# Patient Record
Sex: Female | Born: 1955
Health system: Southern US, Community
[De-identification: ages and names within clinical notes are randomized; demographics above are authoritative.]

## PROBLEM LIST (undated history)

## (undated) DIAGNOSIS — Z973 Presence of spectacles and contact lenses: Secondary | ICD-10-CM

## (undated) DIAGNOSIS — R319 Hematuria, unspecified: Secondary | ICD-10-CM

## (undated) DIAGNOSIS — R3915 Urgency of urination: Secondary | ICD-10-CM

## (undated) DIAGNOSIS — M1990 Unspecified osteoarthritis, unspecified site: Secondary | ICD-10-CM

## (undated) DIAGNOSIS — Z87442 Personal history of urinary calculi: Secondary | ICD-10-CM

## (undated) DIAGNOSIS — Q639 Congenital malformation of kidney, unspecified: Secondary | ICD-10-CM

## (undated) DIAGNOSIS — T7840XA Allergy, unspecified, initial encounter: Secondary | ICD-10-CM

## (undated) DIAGNOSIS — N200 Calculus of kidney: Secondary | ICD-10-CM

## (undated) HISTORY — PX: COLONOSCOPY: SHX174

## (undated) HISTORY — PX: ROTATOR CUFF REPAIR: SHX139

## (undated) HISTORY — PX: FRACTURE SURGERY: SHX138

## (undated) HISTORY — DX: Allergy, unspecified, initial encounter: T78.40XA

---
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
## 1973-12-23 HISTORY — PX: NEPHROLITHOTOMY: SHX5134

## 1984-12-23 HISTORY — PX: TUBAL LIGATION: SHX77

## 2011-12-24 HISTORY — PX: OTHER SURGICAL HISTORY: SHX169

## 2015-09-07 ENCOUNTER — Encounter: Payer: Self-pay | Admitting: Internal Medicine

## 2015-09-07 ENCOUNTER — Ambulatory Visit (INDEPENDENT_AMBULATORY_CARE_PROVIDER_SITE_OTHER): Payer: BLUE CROSS/BLUE SHIELD | Admitting: Internal Medicine

## 2015-09-07 ENCOUNTER — Other Ambulatory Visit (INDEPENDENT_AMBULATORY_CARE_PROVIDER_SITE_OTHER): Payer: BLUE CROSS/BLUE SHIELD

## 2015-09-07 VITALS — BP 130/82 | HR 60 | Temp 98.6°F | Resp 12 | Ht 64.0 in | Wt 141.0 lb

## 2015-09-07 DIAGNOSIS — Z0000 Encounter for general adult medical examination without abnormal findings: Secondary | ICD-10-CM

## 2015-09-07 DIAGNOSIS — Q639 Congenital malformation of kidney, unspecified: Secondary | ICD-10-CM

## 2015-09-07 LAB — CBC
HCT: 41 % (ref 36.0–46.0)
HEMOGLOBIN: 13.9 g/dL (ref 12.0–15.0)
MCHC: 33.8 g/dL (ref 30.0–36.0)
MCV: 89.3 fl (ref 78.0–100.0)
PLATELETS: 213 10*3/uL (ref 150.0–400.0)
RBC: 4.59 Mil/uL (ref 3.87–5.11)
RDW: 13.6 % (ref 11.5–15.5)
WBC: 4.7 10*3/uL (ref 4.0–10.5)

## 2015-09-07 LAB — COMPREHENSIVE METABOLIC PANEL
ALT: 11 U/L (ref 0–35)
AST: 16 U/L (ref 0–37)
Albumin: 4.4 g/dL (ref 3.5–5.2)
Alkaline Phosphatase: 74 U/L (ref 39–117)
BUN: 20 mg/dL (ref 6–23)
CALCIUM: 9.6 mg/dL (ref 8.4–10.5)
CHLORIDE: 102 meq/L (ref 96–112)
CO2: 32 meq/L (ref 19–32)
Creatinine, Ser: 0.83 mg/dL (ref 0.40–1.20)
GFR: 74.17 mL/min (ref >60.00)
GLUCOSE: 92 mg/dL (ref 70–99)
POTASSIUM: 3.9 meq/L (ref 3.5–5.1)
Sodium: 138 mEq/L (ref 135–145)
Total Bilirubin: 0.5 mg/dL (ref 0.2–1.2)
Total Protein: 7.3 g/dL (ref 6.0–8.3)

## 2015-09-07 LAB — LIPID PANEL
CHOL/HDL RATIO: 3
Cholesterol: 178 mg/dL (ref 0–200)
HDL: 63 mg/dL (ref >39.00)
LDL Cholesterol: 104 mg/dL — ABNORMAL HIGH (ref 0–99)
NONHDL: 114.52
Triglycerides: 55 mg/dL (ref 0.0–149.0)
VLDL: 11 mg/dL (ref 0.0–40.0)

## 2015-09-07 LAB — URIC ACID: Uric Acid, Serum: 2.4 mg/dL (ref 2.4–7.0)

## 2015-09-07 NOTE — Assessment & Plan Note (Signed)
Has 2 sacs with 2 functioning kidneys in each sac. Per her bladder is small normal size. Kidney function normal in the past and BP normal. Checking CMP and uric acid today. History of stones.

## 2015-09-07 NOTE — Progress Notes (Signed)
   Subjective:    Patient ID: Alyssa Lamb, female    DOB: 01/04/1956, 59 y.o.   MRN: [id number]  HPI The patient is a 59 YO female coming in new for wellness. No new complaints. 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 Overall normal kidney function but problems with infections and recurrent stones.   PMH, [facility], social history reviewed and updated.   Review of Systems  Constitutional: Negative for fever, activity change, appetite change and unexpected weight change.  HENT: Negative.   Eyes: Negative.   Respiratory: Negative for cough, chest tightness, shortness of breath and wheezing.   Cardiovascular: Negative for chest pain, palpitations and leg swelling.  Gastrointestinal: Negative for nausea, abdominal pain, diarrhea, constipation and abdominal distention.  Musculoskeletal: Positive for arthralgias. Negative for myalgias, back pain and joint swelling.       In the hips  Skin: Negative.   Neurological: Negative.   Psychiatric/Behavioral: Negative.       Objective:   Physical Exam  Constitutional: She is oriented to person, place, and time. She appears well-developed and well-nourished.  HENT:  Head: Normocephalic and atraumatic.  Eyes: EOM are normal.  Neck: Normal range of motion.  Cardiovascular: Normal rate and regular rhythm.   Pulmonary/Chest: Effort normal and breath sounds normal. No respiratory distress. She has no wheezes. She has no rales.  Abdominal: Soft. Bowel sounds are normal. She exhibits no distension. There is no tenderness. There is no rebound.  Musculoskeletal: She exhibits no edema.  Neurological: She is alert and oriented to person, place, and time. Coordination normal.  Skin: Skin is warm and dry.  Psychiatric: She has a normal mood and affect.   Filed Vitals:   09/07/15 1419  BP: 130/82  Pulse: 60  Temp: 98.6 F (37 C)  TempSrc: Oral  Resp: 12  Height: 5\' 4"  (1.626 m)  Weight: 141 lb (63.957 kg)  SpO2: 98%      Assessment &  Plan:  Flu shot given at visit

## 2015-09-07 NOTE — Progress Notes (Signed)
Pre visit review using our clinic review tool, if applicable. No additional management support is needed unless otherwise documented below in the visit note. 

## 2015-09-07 NOTE — Assessment & Plan Note (Signed)
Counseled her about the shingles shot and she will think about it. Flu shot given today. Talked to her about sun cancer prevention with protective clothing and sunscreen. Talked to her about kidney stone prevention and she is drinking enough fluids.

## 2015-09-07 NOTE — Patient Instructions (Signed)
We have given you the flu shot today and will check the blood work.   Think about getting the shingles shot if you want before turning 65 as medicare does not cover it.   Come back in about 1 year, if you have any problems or questions before then please feel free to call us.   Health Maintenance Adopting a healthy lifestyle and getting preventive care can go a long way to promote health and wellness. Talk with your health care provider about what schedule of regular examinations is right for you. This is a good chance for you to check in with your provider about disease prevention and staying healthy. In between checkups, there are plenty of things you can do on your own. Experts have done a lot of research about which lifestyle changes and preventive measures are most likely to keep you healthy. Ask your health care provider for more information. WEIGHT AND DIET  Eat a healthy diet  Be sure to include plenty of vegetables, fruits, low-fat dairy products, and lean protein.  Do not eat a lot of foods high in solid fats, added sugars, or salt.  Get regular exercise. This is one of the most important things you can do for your health.  Most adults should exercise for at least 150 minutes each week. The exercise should increase your heart rate and make you sweat (moderate-intensity exercise).  Most adults should also do strengthening exercises at least twice a week. This is in addition to the moderate-intensity exercise.  Maintain a healthy weight  Body mass index (BMI) is a measurement that can be used to identify possible weight problems. It estimates body fat based on height and weight. Your health care provider can help determine your BMI and help you achieve or maintain a healthy weight.  For females 44 years of age and older:   A BMI below 18.5 is considered underweight.  A BMI of 18.5 to 24.9 is normal.  A BMI of 25 to 29.9 is considered overweight.  A BMI of 30 and above is  considered obese.  Watch levels of cholesterol and blood lipids  You should start having your blood tested for lipids and cholesterol at 61 years of age, then have this test every 5 years.  You may need to have your cholesterol levels checked more often if:  Your lipid or cholesterol levels are high.  You are older than 61 years of age.  You are at high risk for heart disease.  CANCER SCREENING   Lung Cancer  Lung cancer screening is recommended for adults 12-25 years old who are at high risk for lung cancer because of a history of smoking.  A yearly low-dose CT scan of the lungs is recommended for people who:  Currently smoke.  Have quit within the past 15 years.  Have at least a 30-pack-year history of smoking. A pack year is smoking an average of one pack of cigarettes a day for 1 year.  Yearly screening should continue until it has been 15 years since you quit.  Yearly screening should stop if you develop a health problem that would prevent you from having lung cancer treatment.  Breast Cancer  Practice breast self-awareness. This means understanding how your breasts normally appear and feel.  It also means doing regular breast self-exams. Let your health care provider know about any changes, no matter how small.  If you are in your 20s or 30s, you should have a clinical breast exam (CBE)  by a health care provider every 1-3 years as part of a regular health exam.  If you are 40 or older, have a CBE every year. Also consider having a breast X-ray (mammogram) every year.  If you have a family history of breast cancer, talk to your health care provider about genetic screening.  If you are at high risk for breast cancer, talk to your health care provider about having an MRI and a mammogram every year.  Breast cancer gene (BRCA) assessment is recommended for women who have family members with BRCA-related cancers. BRCA-related cancers  include:  Breast.  Ovarian.  Tubal.  Peritoneal cancers.  Results of the assessment will determine the need for genetic counseling and BRCA1 and BRCA2 testing. Cervical Cancer Routine pelvic examinations to screen for cervical cancer are no longer recommended for nonpregnant women who are considered low risk for cancer of the pelvic organs (ovaries, uterus, and vagina) and who do not have symptoms. A pelvic examination may be necessary if you have symptoms including those associated with pelvic infections. Ask your health care provider if a screening pelvic exam is right for you.   The Pap test is the screening test for cervical cancer for women who are considered at risk.  If you had a hysterectomy for a problem that was not cancer or a condition that could lead to cancer, then you no longer need Pap tests.  If you are older than 65 years, and you have had normal Pap tests for the past 10 years, you no longer need to have Pap tests.  If you have had past treatment for cervical cancer or a condition that could lead to cancer, you need Pap tests and screening for cancer for at least 20 years after your treatment.  If you no longer get a Pap test, assess your risk factors if they change (such as having a new sexual partner). This can affect whether you should start being screened again.  Some women have medical problems that increase their chance of getting cervical cancer. If this is the case for you, your health care provider may recommend more frequent screening and Pap tests.  The human papillomavirus (HPV) test is another test that may be used for cervical cancer screening. The HPV test looks for the virus that can cause cell changes in the cervix. The cells collected during the Pap test can be tested for HPV.  The HPV test can be used to screen women 30 years of age and older. Getting tested for HPV can extend the interval between normal Pap tests from three to five years.  An HPV  test also should be used to screen women of any age who have unclear Pap test results.  After 61 years of age, women should have HPV testing as often as Pap tests.  Colorectal Cancer  This type of cancer can be detected and often prevented.  Routine colorectal cancer screening usually begins at 61 years of age and continues through 61 years of age.  Your health care provider may recommend screening at an earlier age if you have risk factors for colon cancer.  Your health care provider may also recommend using home test kits to check for hidden blood in the stool.  A small camera at the end of a tube can be used to examine your colon directly (sigmoidoscopy or colonoscopy). This is done to check for the earliest forms of colorectal cancer.  Routine screening usually begins at age 50.  Direct   examination of the colon should be repeated every 5-10 years through 61 years of age. However, you may need to be screened more often if early forms of precancerous polyps or small growths are found. Skin Cancer  Check your skin from head to toe regularly.  Tell your health care provider about any new moles or changes in moles, especially if there is a change in a mole's shape or color.  Also tell your health care provider if you have a mole that is larger than the size of a pencil eraser.  Always use sunscreen. Apply sunscreen liberally and repeatedly throughout the day.  Protect yourself by wearing long sleeves, pants, a wide-brimmed hat, and sunglasses whenever you are outside. HEART DISEASE, DIABETES, AND HIGH BLOOD PRESSURE   Have your blood pressure checked at least every 1-2 years. High blood pressure causes heart disease and increases the risk of stroke.  If you are between 55 years and 79 years old, ask your health care provider if you should take aspirin to prevent strokes.  Have regular diabetes screenings. This involves taking a blood sample to check your fasting blood sugar  level.  If you are at a normal weight and have a low risk for diabetes, have this test once every three years after 61 years of age.  If you are overweight and have a high risk for diabetes, consider being tested at a younger age or more often. PREVENTING INFECTION  Hepatitis B  If you have a higher risk for hepatitis B, you should be screened for this virus. You are considered at high risk for hepatitis B if:  You were born in a country where hepatitis B is common. Ask your health care provider which countries are considered high risk.  Your parents were born in a high-risk country, and you have not been immunized against hepatitis B (hepatitis B vaccine).  You have HIV or AIDS.  You use needles to inject street drugs.  You live with someone who has hepatitis B.  You have had sex with someone who has hepatitis B.  You get hemodialysis treatment.  You take certain medicines for conditions, including cancer, organ transplantation, and autoimmune conditions. Hepatitis C  Blood testing is recommended for:  Everyone born from 1945 through 1965.  Anyone with known risk factors for hepatitis C. Sexually transmitted infections (STIs)  You should be screened for sexually transmitted infections (STIs) including gonorrhea and chlamydia if:  You are sexually active and are younger than 61 years of age.  You are older than 61 years of age and your health care provider tells you that you are at risk for this type of infection.  Your sexual activity has changed since you were last screened and you are at an increased risk for chlamydia or gonorrhea. Ask your health care provider if you are at risk.  If you do not have HIV, but are at risk, it may be recommended that you take a prescription medicine daily to prevent HIV infection. This is called pre-exposure prophylaxis (PrEP). You are considered at risk if:  You are sexually active and do not regularly use condoms or know the HIV status  of your partner(s).  You take drugs by injection.  You are sexually active with a partner who has HIV. Talk with your health care provider about whether you are at high risk of being infected with HIV. If you choose to begin PrEP, you should first be tested for HIV. You should then be tested   every 3 months for as long as you are taking PrEP.  PREGNANCY   If you are premenopausal and you may become pregnant, ask your health care provider about preconception counseling.  If you may become pregnant, take 400 to 800 micrograms (mcg) of folic acid every day.  If you want to prevent pregnancy, talk to your health care provider about birth control (contraception). OSTEOPOROSIS AND MENOPAUSE   Osteoporosis is a disease in which the bones lose minerals and strength with aging. This can result in serious bone fractures. Your risk for osteoporosis can be identified using a bone density scan.  If you are 65 years of age or older, or if you are at risk for osteoporosis and fractures, ask your health care provider if you should be screened.  Ask your health care provider whether you should take a calcium or vitamin D supplement to lower your risk for osteoporosis.  Menopause may have certain physical symptoms and risks.  Hormone replacement therapy may reduce some of these symptoms and risks. Talk to your health care provider about whether hormone replacement therapy is right for you.  HOME CARE INSTRUCTIONS   Schedule regular health, dental, and eye exams.  Stay current with your immunizations.   Do not use any tobacco products including cigarettes, chewing tobacco, or electronic cigarettes.  If you are pregnant, do not drink alcohol.  If you are breastfeeding, limit how much and how often you drink alcohol.  Limit alcohol intake to no more than 1 drink per day for nonpregnant women. One drink equals 12 ounces of beer, 5 ounces of wine, or 1 ounces of hard liquor.  Do not use street  drugs.  Do not share needles.  Ask your health care provider for help if you need support or information about quitting drugs.  Tell your health care provider if you often feel depressed.  Tell your health care provider if you have ever been abused or do not feel safe at home. Document Released: 06/24/2011 Document Revised: 04/25/2014 Document Reviewed: 11/10/2013 [employer] Patient Information 2015 [employer]. This information is not intended to replace advice given to you by your health care provider. Make sure you discuss any questions you have with your health care provider.  

## 2015-09-11 ENCOUNTER — Telehealth: Payer: Self-pay | Admitting: Internal Medicine

## 2015-09-11 DIAGNOSIS — N950 Postmenopausal bleeding: Secondary | ICD-10-CM

## 2015-09-11 NOTE — Telephone Encounter (Signed)
Patient called in advising that she has bright red blood in her urine. She is requesting a referral to a urologist. Please contact patient with update.

## 2015-09-11 NOTE — Telephone Encounter (Signed)
Will place referral for Ob/Gyn or she can get in with an Ob/Gyn

## 2015-09-11 NOTE — Telephone Encounter (Signed)
Patient called to advise that she has 100% verified that the bleeding is not in her urine, but from her uterus.

## 2015-09-12 ENCOUNTER — Encounter: Payer: Self-pay | Admitting: Gynecology

## 2015-09-12 ENCOUNTER — Emergency Department (HOSPITAL_COMMUNITY): Payer: BLUE CROSS/BLUE SHIELD

## 2015-09-12 ENCOUNTER — Emergency Department (HOSPITAL_COMMUNITY)
Admission: EM | Admit: 2015-09-12 | Discharge: 2015-09-12 | Disposition: A | Discharge status: Home / Self Care | Payer: BLUE CROSS/BLUE SHIELD | Attending: Emergency Medicine | Admitting: Emergency Medicine

## 2015-09-12 ENCOUNTER — Encounter (INDEPENDENT_AMBULATORY_CARE_PROVIDER_SITE_OTHER): Payer: Self-pay | Admitting: Gynecology

## 2015-09-12 ENCOUNTER — Encounter (HOSPITAL_COMMUNITY): Payer: Self-pay | Admitting: Emergency Medicine

## 2015-09-12 DIAGNOSIS — Z880 Allergy status to penicillin: Secondary | ICD-10-CM | POA: Insufficient documentation

## 2015-09-12 DIAGNOSIS — Z87440 Personal history of urinary (tract) infections: Secondary | ICD-10-CM | POA: Diagnosis not present

## 2015-09-12 DIAGNOSIS — Z87891 Personal history of nicotine dependence: Secondary | ICD-10-CM | POA: Insufficient documentation

## 2015-09-12 DIAGNOSIS — Z79899 Other long term (current) drug therapy: Secondary | ICD-10-CM | POA: Insufficient documentation

## 2015-09-12 DIAGNOSIS — N200 Calculus of kidney: Secondary | ICD-10-CM | POA: Diagnosis not present

## 2015-09-12 DIAGNOSIS — R109 Unspecified abdominal pain: Secondary | ICD-10-CM | POA: Diagnosis present

## 2015-09-12 LAB — URINALYSIS, ROUTINE W REFLEX MICROSCOPIC
BILIRUBIN URINE: NEGATIVE
GLUCOSE, UA: NEGATIVE mg/dL
KETONES UR: 15 mg/dL — AB
Nitrite: NEGATIVE
PH: 6 (ref 5.0–8.0)
Protein, ur: >300 mg/dL — AB
Specific Gravity, Urine: 1.019 (ref 1.005–1.030)
Urobilinogen, UA: 0.2 mg/dL (ref 0.0–1.0)

## 2015-09-12 LAB — CBC WITH DIFFERENTIAL/PLATELET
BASOS PCT: 1 %
Basophils Absolute: 0 10*3/uL (ref 0.0–0.1)
EOS ABS: 0.2 10*3/uL (ref 0.0–0.7)
Eosinophils Relative: 5 %
HEMATOCRIT: 38.4 % (ref 36.0–46.0)
Hemoglobin: 13.1 g/dL (ref 12.0–15.0)
Lymphocytes Relative: 40 %
Lymphs Abs: 1.6 10*3/uL (ref 0.7–4.0)
MCH: 30.3 pg (ref 26.0–34.0)
MCHC: 34.1 g/dL (ref 30.0–36.0)
MCV: 88.9 fL (ref 78.0–100.0)
MONO ABS: 0.3 10*3/uL (ref 0.1–1.0)
MONOS PCT: 7 %
NEUTROS ABS: 1.9 10*3/uL (ref 1.7–7.7)
Neutrophils Relative %: 47 %
Platelets: 180 10*3/uL (ref 150–400)
RBC: 4.32 MIL/uL (ref 3.87–5.11)
RDW: 13.2 % (ref 11.5–15.5)
WBC: 3.9 10*3/uL — ABNORMAL LOW (ref 4.0–10.5)

## 2015-09-12 LAB — COMPREHENSIVE METABOLIC PANEL
ALBUMIN: 3.9 g/dL (ref 3.5–5.0)
ALK PHOS: 73 U/L (ref 38–126)
ALT: 14 U/L (ref 14–54)
AST: 18 U/L (ref 15–41)
Anion gap: 5 (ref 5–15)
BILIRUBIN TOTAL: 0.4 mg/dL (ref 0.3–1.2)
BUN: 21 mg/dL — ABNORMAL HIGH (ref 6–20)
CALCIUM: 8.9 mg/dL (ref 8.9–10.3)
CO2: 27 mmol/L (ref 22–32)
CREATININE: 0.8 mg/dL (ref 0.44–1.00)
Chloride: 102 mmol/L (ref 101–111)
GFR calc Af Amer: >60 mL/min (ref >60)
GFR calc non Af Amer: >60 mL/min (ref >60)
GLUCOSE: 100 mg/dL — AB (ref 65–99)
Potassium: 4.4 mmol/L (ref 3.5–5.1)
SODIUM: 134 mmol/L — AB (ref 135–145)
TOTAL PROTEIN: 6.3 g/dL — AB (ref 6.5–8.1)

## 2015-09-12 LAB — LIPASE, BLOOD: Lipase: 32 U/L (ref 22–51)

## 2015-09-12 LAB — URINE MICROSCOPIC-ADD ON

## 2015-09-12 MED ORDER — SODIUM CHLORIDE 0.9 % IV BOLUS (SEPSIS): 500.0000 mL | Freq: Once | INTRAVENOUS | Status: DC

## 2015-09-12 MED ORDER — KETOROLAC TROMETHAMINE 30 MG/ML IJ SOLN: 30.0000 mg | Freq: Once | INTRAMUSCULAR | Status: DC

## 2015-09-12 MED ORDER — ONDANSETRON HCL 4 MG PO TABS: 4.0000 mg | ORAL_TABLET | Freq: Four times a day (QID) | ORAL | Status: DC

## 2015-09-12 MED ORDER — OXYCODONE-ACETAMINOPHEN 5-325 MG PO TABS: 1.0000 | ORAL_TABLET | ORAL | Status: DC | PRN

## 2015-09-12 NOTE — Discharge Instructions (Signed)

## 2015-09-12 NOTE — ED Notes (Signed)
PT refusing IV and pain meds; states has no pain.

## 2015-09-12 NOTE — ED Notes (Signed)
Patient transported to Ultrasound 

## 2015-09-12 NOTE — ED Notes (Signed)
Pt arrives from home via [facility] c/o flank pain beginning tonight at 0500.  Pt reports hematuria since Friday with a hx of kidney stones.  Pt also reports norm HR is mid 50's.  Pt endorses nausea, denies vomiting.  Pt reports no pain at this time.

## 2015-09-12 NOTE — ED Provider Notes (Signed)
CSN: [id number]     Arrival date & time 09/12/15  0608 History   First MD Initiated Contact with Patient 09/12/15 [phone]     Chief Complaint  Patient presents with  . Flank Pain     (Consider location/radiation/quality/duration/timing/severity/associated sxs/prior Treatment) HPI   Patient is a 59 year old female with history of kidney stones with multiple stenting, otherwise healthy, recently moved here, she comes into the emergency department today with sudden onset left flank pain that began at 5 AM associated with nausea and with hematuria since Friday. She stated the pain was sharp and located in the low left flank without any radiation, which is unusual for her kidney stones. She had some nausea but did not have any vomiting. She has not had any fever, chills, sweats, diarrhea. She has not had any abdominal distention or discomfort with eating and has not had any acid reflux symptoms.  She denies anterior abdomen or pelvis pain. She was recently established with [facility] for primary care and had discussed getting established with urology but has not yet seen a urologist in [place]. She reports a history of multiple stones in both kidneys, multiple stenting and unsuccessful lithotripsy in the past. She states that she has never actually passed a stone.  Upon my exam she is not complaining of any flank pain or nausea. She does not have any other complaints this time, denies chest pain, shortness of breath, weakness, headache.  Past Medical History  Diagnosis Date  . Kidney stones   . Urine incontinence   . UTI (urinary tract infection)    Past Surgical History  Procedure Laterality Date  . Shoulder surgery Right   . Tubular pregnancy    . Ankle surgery Right   . Kidney stone  Right     kidnent stone removed surgically 1984    Family History  Problem Relation Age of Onset  . Arthritis Mother   . Hyperlipidemia Mother   . Heart disease Mother   . Hypertension Mother   . Diabetes  Mother    Social History  Substance Use Topics  . Smoking status: Former Research scientist (life sciences)  . Smokeless tobacco: None  . Alcohol Use: No   OB History    No data available     Review of Systems  Constitutional: Negative.  Negative for fever, activity change, appetite change and fatigue.  HENT: Negative.   Eyes: Negative.   Respiratory: Negative.  Negative for cough and shortness of breath.   Cardiovascular: Negative.  Negative for chest pain, palpitations and leg swelling.  Gastrointestinal: Negative for vomiting, abdominal pain, diarrhea, constipation, blood in stool, abdominal distention, anal bleeding and rectal pain.  Genitourinary: Positive for hematuria and flank pain. Negative for difficulty urinating and pelvic pain.  Musculoskeletal: Negative.   Skin: Negative.   Neurological: Negative.   Hematological: Negative.   Psychiatric/Behavioral: Negative.       Allergies  Penicillins  Home Medications   Prior to Admission medications   Medication Sig Start Date End Date Taking? Authorizing Leslea Vowles  acetaminophen (TYLENOL) 500 MG tablet Take 1,000 mg by mouth every 6 (six) hours as needed for moderate pain.   Yes Historical Kailon Treese, MD  beta carotene w/minerals (OCUVITE) tablet Take 1 tablet by mouth daily.   Yes Historical Monette Omara, MD  Cholecalciferol (VITAMIN D) 2000 UNITS tablet Take 2,000 Units by mouth daily.   Yes Historical Helyne Genther, MD  Doxylamine Succinate, Sleep, (SLEEP AID PO) Take by mouth.   Yes Historical Elisha Cooksey, MD  Lactobacillus (PROBIOTIC  ACIDOPHILUS PO) Take by mouth.   Yes Historical Marye Eagen, MD  ondansetron (ZOFRAN) 4 MG tablet Take 1 tablet (4 mg total) by mouth every 6 (six) hours. 09/12/15   Delsa Grana, PA-C  oxyCODONE-acetaminophen (PERCOCET) 5-325 MG per tablet Take 1 tablet by mouth every 4 (four) hours as needed. 09/12/15   Delsa Grana, PA-C   BP 117/71 mmHg  Pulse 52  Temp(Src) 97.9 F (36.6 C) (Oral)  Resp 22  Ht 5\' 5"  (1.651 m)  Wt 145 lb  (65.772 kg)  BMI 24.13 kg/m2  SpO2 99% Physical Exam  Constitutional: She is oriented to person, place, and time. She appears well-developed and well-nourished. No distress.  Well appearing female, appears stated age, does not appear to be uncomfortable, nontoxic-appearing  HENT:  Head: Normocephalic and atraumatic.  Nose: Nose normal.  Mouth/Throat: Oropharynx is clear and moist. No oropharyngeal exudate.  Eyes: Conjunctivae and EOM are normal. Pupils are equal, round, and reactive to light. Right eye exhibits no discharge. Left eye exhibits no discharge. No scleral icterus.  Neck: Normal range of motion. No JVD present. No tracheal deviation present. No thyromegaly present.  Cardiovascular: Normal rate, regular rhythm, normal heart sounds and intact distal pulses.  Exam reveals no gallop and no friction rub.   No murmur heard. Pulmonary/Chest: Effort normal and breath sounds normal. No respiratory distress. She has no wheezes. She has no rales. She exhibits no tenderness.  Abdominal: Soft. Bowel sounds are normal. She exhibits no distension and no mass. There is no tenderness. There is no rebound and no guarding.  Scar over right flank, no CVA tenderness bilaterally  Musculoskeletal: Normal range of motion. She exhibits no edema or tenderness.  Lymphadenopathy:    She has no cervical adenopathy.  Neurological: She is alert and oriented to person, place, and time. She has normal reflexes. No cranial nerve deficit. She exhibits normal muscle tone. Coordination normal.  Skin: Skin is warm and dry. No rash noted. She is not diaphoretic. No erythema. No pallor.  Psychiatric: She has a normal mood and affect. Her behavior is normal. Judgment and thought content normal.  Nursing note and vitals reviewed.   ED Course  Procedures (including critical care time) Labs Review Labs Reviewed  URINALYSIS, ROUTINE W REFLEX MICROSCOPIC (NOT AT [facility]) - Abnormal; Notable for the following:    Color,  Urine RED (*)    APPearance TURBID (*)    Hgb urine dipstick LARGE (*)    Ketones, ur 15 (*)    Protein, ur >300 (*)    Leukocytes, UA SMALL (*)    All other components within normal limits  CBC WITH DIFFERENTIAL/PLATELET - Abnormal; Notable for the following:    WBC 3.9 (*)    All other components within normal limits  COMPREHENSIVE METABOLIC PANEL - Abnormal; Notable for the following:    Sodium 134 (*)    Glucose, Bld 100 (*)    BUN 21 (*)    Total Protein 6.3 (*)    All other components within normal limits  LIPASE, BLOOD  URINE MICROSCOPIC-ADD ON    Imaging Review US Abdomen Complete  09/12/2015   CLINICAL DATA:  Left flank pain, nausea.  History kidney stones.  EXAM: ULTRASOUND ABDOMEN COMPLETE  COMPARISON:  None.  FINDINGS: Gallbladder: 3 mm and 4 mm polyps noted along the anterior gallbladder wall. No stones. No wall thickening. Negative sonographic Murphy's.  Common bile duct: Diameter: Normal caliber, 6 mm.  Liver: 7 mm cyst. No other focal abnormality.  No biliary ductal dilatation. Normal echotexture.  IVC: No abnormality visualized.  Pancreas: Visualized portion unremarkable.  Spleen: Size and appearance within normal limits.  Right Kidney: Length: 11.8 cm. 1 cm midpole stone. 8 mm lower pole stone. No obstruction. No hydronephrosis.  Left Kidney: Length: 12.8 cm. 13 mm upper pole stone and adjacent 14 mm upper pole stone. 18 mm lower pole cyst. Normal echotexture. No hydronephrosis or obstruction.  Abdominal aorta: No aneurysm visualized.  Other findings: None.  IMPRESSION: Bilateral nephrolithiasis.  No hydronephrosis.  Small gallbladder wall polyps.  No acute findings.   Electronically Signed   By: Rolm Baptise M.D.   On: 09/12/2015 09:16   I have personally reviewed and evaluated these images and lab results as part of my medical decision-making.   EKG Interpretation None      MDM   Final diagnoses:  Nephrolithiasis    Patient with left flank pain, hematuria and  complex kidney stone history She had sudden onset left flank pain this morning which has spontaneously resolved, was associated with nausea but no active vomiting and currently asymptomatic at this time Patient had labs and a visit with Dr. Doug Sou 5 days ago, all lab work was within normal limits, and patient was intent on getting established with urology. There are no records to refer to or through care everywhere. Will obtain basic labs, urinalysis and imaging for evaluation of nephrolithiasis/hydronephrosis.   The patient is requesting to see a urologist with [facility], this referral will likely need to be initiated with her PCP however I have offered to contact our on-call urologist with resultant to establish care more quickly if needed.  7:51 AM The nurse informed me that the patient is refusing IV her pain medicine due to not having any pain currently. She is refusing IM Toradol.   9:37 AM Pt has bilateral kidney stones, she does not have any obstruction and does not have any hydronephrosis.  Patient has had no vomiting while here in the ER. Her urine does not appear infected at this time. She is afebrile. She will be discharged home and will be instructed to seek referral from her PCP but Dr. Minus Liberty is on call for urology and was also given as a resource to establish with for urology care. The patient was given a prescription for Zofran and Percocet to be able to manage her pain at home. She was instructed to return to the emergency department with refractory vomiting nausea, fever, chills, sweats or pain that is not managed with her over-the-counter meds and with the Percocet.  Filed Vitals:   09/12/15 0614 09/12/15 0615 09/12/15 0800  BP: 141/79 155/77 117/71  Pulse: 59 52 52  Temp: 97.9 F (36.6 C)    TempSrc: Oral    Resp: 22    Height: 5\' 5"  (1.651 m)    Weight: 145 lb (65.772 kg)    SpO2: 100% 100% 99%     Delsa Grana, PA-C 09/12/15 0945  Varney Biles, MD 09/12/15  1635

## 2015-09-12 NOTE — ED Notes (Signed)
Pt ambulated to restroom without distress.  

## 2015-09-13 NOTE — Progress Notes (Signed)
Error entry

## 2015-09-14 ENCOUNTER — Ambulatory Visit: Payer: BLUE CROSS/BLUE SHIELD | Admitting: Internal Medicine

## 2015-09-18 ENCOUNTER — Encounter (HOSPITAL_BASED_OUTPATIENT_CLINIC_OR_DEPARTMENT_OTHER): Payer: Self-pay | Admitting: *Deleted

## 2015-09-18 ENCOUNTER — Other Ambulatory Visit: Payer: Self-pay | Admitting: Urology

## 2015-09-19 ENCOUNTER — Encounter (HOSPITAL_BASED_OUTPATIENT_CLINIC_OR_DEPARTMENT_OTHER): Payer: Self-pay | Admitting: *Deleted

## 2015-09-19 NOTE — Progress Notes (Signed)
NPO AFTER MN WITH EXCEPTION CLEAR LIQUIDS UNTIL 0830 (NO CREAM/MILK PRODUCTS).  ARRIVE AT 1300.  CURRENT LAB RESULTS IN CHART AND EPIC. MAY TAKE OXYCODONE / ZOFRAN IF NEEDED AM DOS W/ SIPS OF WATER.

## 2015-09-22 ENCOUNTER — Ambulatory Visit (HOSPITAL_BASED_OUTPATIENT_CLINIC_OR_DEPARTMENT_OTHER)
Admission: RE | Admit: 2015-09-22 | Discharge: 2015-09-22 | Disposition: A | Discharge status: Home / Self Care | Payer: BLUE CROSS/BLUE SHIELD | Source: Ambulatory Visit | Attending: Urology | Admitting: Urology

## 2015-09-22 ENCOUNTER — Ambulatory Visit (HOSPITAL_BASED_OUTPATIENT_CLINIC_OR_DEPARTMENT_OTHER): Payer: BLUE CROSS/BLUE SHIELD | Admitting: Anesthesiology

## 2015-09-22 ENCOUNTER — Encounter (HOSPITAL_BASED_OUTPATIENT_CLINIC_OR_DEPARTMENT_OTHER)
Admission: RE | Disposition: A | Discharge status: Home / Self Care | Payer: Self-pay | Source: Ambulatory Visit | Attending: Urology

## 2015-09-22 ENCOUNTER — Encounter (HOSPITAL_BASED_OUTPATIENT_CLINIC_OR_DEPARTMENT_OTHER): Payer: Self-pay | Admitting: *Deleted

## 2015-09-22 DIAGNOSIS — Q638 Other specified congenital malformations of kidney: Secondary | ICD-10-CM | POA: Diagnosis not present

## 2015-09-22 DIAGNOSIS — Z87891 Personal history of nicotine dependence: Secondary | ICD-10-CM | POA: Diagnosis not present

## 2015-09-22 DIAGNOSIS — Z87442 Personal history of urinary calculi: Secondary | ICD-10-CM | POA: Insufficient documentation

## 2015-09-22 DIAGNOSIS — N132 Hydronephrosis with renal and ureteral calculous obstruction: Secondary | ICD-10-CM | POA: Diagnosis not present

## 2015-09-22 DIAGNOSIS — M1990 Unspecified osteoarthritis, unspecified site: Secondary | ICD-10-CM | POA: Diagnosis not present

## 2015-09-22 DIAGNOSIS — Z79899 Other long term (current) drug therapy: Secondary | ICD-10-CM | POA: Diagnosis not present

## 2015-09-22 DIAGNOSIS — Z79891 Long term (current) use of opiate analgesic: Secondary | ICD-10-CM | POA: Diagnosis not present

## 2015-09-22 DIAGNOSIS — N201 Calculus of ureter: Secondary | ICD-10-CM

## 2015-09-22 HISTORY — DX: Personal history of urinary calculi: Z87.442

## 2015-09-22 HISTORY — PX: CYSTOSCOPY WITH RETROGRADE PYELOGRAM, URETEROSCOPY AND STENT PLACEMENT: SHX5789

## 2015-09-22 HISTORY — DX: Urgency of urination: R39.15

## 2015-09-22 HISTORY — DX: Hematuria, unspecified: R31.9

## 2015-09-22 HISTORY — DX: Calculus of kidney: N20.0

## 2015-09-22 HISTORY — DX: Unspecified osteoarthritis, unspecified site: M19.90

## 2015-09-22 HISTORY — PX: HOLMIUM LASER APPLICATION: SHX5852

## 2015-09-22 HISTORY — DX: Congenital malformation of kidney, unspecified: Q63.9

## 2015-09-22 HISTORY — DX: Presence of spectacles and contact lenses: Z97.3

## 2015-09-22 SURGERY — CYSTOURETEROSCOPY, WITH RETROGRADE PYELOGRAM AND STENT INSERTION
Anesthesia: General | Site: Renal | Laterality: Left

## 2015-09-22 MED ORDER — DEXAMETHASONE SODIUM PHOSPHATE 4 MG/ML IJ SOLN
INTRAMUSCULAR | Status: DC | PRN
  Administered 2015-09-22: 10 mg via INTRAVENOUS

## 2015-09-22 MED ORDER — LIDOCAINE HCL (CARDIAC) 20 MG/ML IV SOLN
INTRAVENOUS | Status: DC | PRN
  Administered 2015-09-22: 60 mg via INTRAVENOUS

## 2015-09-22 MED ORDER — IOHEXOL 350 MG/ML SOLN
INTRAVENOUS | Status: DC | PRN
  Administered 2015-09-22: 10 mL

## 2015-09-22 MED ORDER — SULFAMETHOXAZOLE-TRIMETHOPRIM 800-160 MG PO TABS: 1.0000 | ORAL_TABLET | Freq: Two times a day (BID) | ORAL | Status: DC

## 2015-09-22 MED ORDER — KETOROLAC TROMETHAMINE 30 MG/ML IJ SOLN
INTRAMUSCULAR | Status: DC | PRN
  Administered 2015-09-22: 30 mg via INTRAVENOUS

## 2015-09-22 MED ORDER — FENTANYL CITRATE (PF) 100 MCG/2ML IJ SOLN
INTRAMUSCULAR | Status: AC
  Filled 2015-09-22: qty 4

## 2015-09-22 MED ORDER — CIPROFLOXACIN IN D5W 400 MG/200ML IV SOLN
400.0000 mg | INTRAVENOUS | Status: DC
  Filled 2015-09-22: qty 200

## 2015-09-22 MED ORDER — MIDAZOLAM HCL 2 MG/2ML IJ SOLN
INTRAMUSCULAR | Status: AC
  Filled 2015-09-22: qty 2

## 2015-09-22 MED ORDER — PROMETHAZINE HCL 25 MG/ML IJ SOLN
6.2500 mg | INTRAMUSCULAR | Status: DC | PRN
Start: 2015-09-22 — End: 2015-09-22
  Filled 2015-09-22: qty 1

## 2015-09-22 MED ORDER — SODIUM CHLORIDE 0.9 % IR SOLN
Status: DC | PRN
  Administered 2015-09-22: 4000 mL

## 2015-09-22 MED ORDER — ONDANSETRON HCL 4 MG/2ML IJ SOLN
INTRAMUSCULAR | Status: DC | PRN
  Administered 2015-09-22: 4 mg via INTRAVENOUS

## 2015-09-22 MED ORDER — LACTATED RINGERS IV SOLN
INTRAVENOUS | Status: DC
  Administered 2015-09-22: 14:00:00 via INTRAVENOUS
  Filled 2015-09-22: qty 1000

## 2015-09-22 MED ORDER — CIPROFLOXACIN IN D5W 400 MG/200ML IV SOLN
INTRAVENOUS | Status: AC
  Filled 2015-09-22: qty 200

## 2015-09-22 MED ORDER — SCOPOLAMINE 1 MG/3DAYS TD PT72
MEDICATED_PATCH | TRANSDERMAL | Status: AC
  Filled 2015-09-22: qty 1

## 2015-09-22 MED ORDER — OXYBUTYNIN CHLORIDE 5 MG PO TABS: 5.0000 mg | ORAL_TABLET | Freq: Three times a day (TID) | ORAL | Status: DC

## 2015-09-22 MED ORDER — ACETAMINOPHEN 10 MG/ML IV SOLN
INTRAVENOUS | Status: DC | PRN
  Administered 2015-09-22: 1000 mg via INTRAVENOUS

## 2015-09-22 MED ORDER — FENTANYL CITRATE (PF) 100 MCG/2ML IJ SOLN
INTRAMUSCULAR | Status: DC | PRN
  Administered 2015-09-22: 50 ug via INTRAVENOUS

## 2015-09-22 MED ORDER — SCOPOLAMINE 1 MG/3DAYS TD PT72
1.0000 | MEDICATED_PATCH | Freq: Once | TRANSDERMAL | Status: DC
  Administered 2015-09-22: 1.5 mg via TRANSDERMAL
  Filled 2015-09-22: qty 1

## 2015-09-22 MED ORDER — LACTATED RINGERS IV SOLN
INTRAVENOUS | Status: DC
  Filled 2015-09-22: qty 1000

## 2015-09-22 MED ORDER — PROPOFOL 10 MG/ML IV BOLUS
INTRAVENOUS | Status: DC | PRN
  Administered 2015-09-22: 200 mg via INTRAVENOUS

## 2015-09-22 MED ORDER — FENTANYL CITRATE (PF) 100 MCG/2ML IJ SOLN
25.0000 ug | INTRAMUSCULAR | Status: DC | PRN
  Filled 2015-09-22: qty 1

## 2015-09-22 MED ORDER — MEPERIDINE HCL 25 MG/ML IJ SOLN
6.2500 mg | INTRAMUSCULAR | Status: DC | PRN
  Filled 2015-09-22: qty 1

## 2015-09-22 MED ORDER — CIPROFLOXACIN IN D5W 400 MG/200ML IV SOLN
INTRAVENOUS | Status: DC | PRN
  Administered 2015-09-22: 400 mg via INTRAVENOUS

## 2015-09-22 MED ORDER — PHENYLEPHRINE HCL 10 MG/ML IJ SOLN
INTRAMUSCULAR | Status: DC | PRN
  Administered 2015-09-22: 80 ug via INTRAVENOUS

## 2015-09-22 SURGICAL SUPPLY — 25 items
ADAPTER CATH URET PLST 4-6FR (CATHETERS) IMPLANT
BAG DRAIN URO-CYSTO SKYTR STRL (DRAIN) ×3 IMPLANT
BASKET LASER NITINOL 1.9FR (BASKET) ×3 IMPLANT
CANISTER SUCT LVC 12 LTR MEDI- (MISCELLANEOUS) IMPLANT
CATH INTERMIT  6FR 70CM (CATHETERS) IMPLANT
CLOTH BEACON ORANGE TIMEOUT ST (SAFETY) ×3 IMPLANT
GLOVE BIO SURGEON STRL SZ8 (GLOVE) ×3 IMPLANT
GLOVE BIOGEL M 6.5 STRL (GLOVE) ×3 IMPLANT
GLOVE INDICATOR 6.5 STRL GRN (GLOVE) ×6 IMPLANT
GOWN STRL REUS W/ TWL LRG LVL3 (GOWN DISPOSABLE) ×1 IMPLANT
GOWN STRL REUS W/ TWL XL LVL3 (GOWN DISPOSABLE) ×1 IMPLANT
GOWN STRL REUS W/TWL LRG LVL3 (GOWN DISPOSABLE) ×5 IMPLANT
GOWN STRL REUS W/TWL XL LVL3 (GOWN DISPOSABLE) ×5 IMPLANT
GUIDEWIRE 0.038 PTFE COATED (WIRE) IMPLANT
GUIDEWIRE ANG ZIPWIRE 038X150 (WIRE) IMPLANT
GUIDEWIRE STR DUAL SENSOR (WIRE) ×3 IMPLANT
IV NS 1000ML (IV SOLUTION) ×2
IV NS 1000ML BAXH (IV SOLUTION) ×1 IMPLANT
IV NS IRRIG 3000ML ARTHROMATIC (IV SOLUTION) ×3 IMPLANT
LASER FIBER DISP (UROLOGICAL SUPPLIES) IMPLANT
MANIFOLD NEPTUNE II (INSTRUMENTS) ×3 IMPLANT
NS IRRIG 500ML POUR BTL (IV SOLUTION) IMPLANT
PACK CYSTO (CUSTOM PROCEDURE TRAY) ×3 IMPLANT
SHEATH ACCESS URETERAL 38CM (SHEATH) ×3 IMPLANT
STENT URET 6FRX24 CONTOUR (STENTS) ×3 IMPLANT

## 2015-09-22 NOTE — H&P (Signed)
H&P  Chief Complaint: kidney stone and left  History of Present Illness: Alyssa Lamb is a 59 y.o. year old female who presents for ureteroscopic management of a symptomatic left infundibular stone as well as several other left renal stones.  She has a prior history of urolithiasis.  By the patient's history, she has bilateral duplicated collecting systems.  Past Medical History  Diagnosis Date  . Urgency of urination   . Hematuria   . 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 Nephrolithiasis     bilateral  . History of kidney stones   . Wears glasses   . Arthritis     Past Surgical History  Procedure Laterality Date  . Right shoulder surgery  2013    repair nerve  . Nephrolithotomy  1975  . Orif right ankle fx  1967    Home Medications:  Medications Prior to Admission  Medication Sig Dispense Refill  . acetaminophen (TYLENOL) 500 MG tablet Take 1,000 mg by mouth every 6 (six) hours as needed for moderate pain.    . beta carotene w/minerals (OCUVITE) tablet Take 1 tablet by mouth daily.    . Cholecalciferol (VITAMIN D) 2000 UNITS tablet Take 2,000 Units by mouth daily.    . Doxylamine Succinate, Sleep, (SLEEP AID PO) Take by mouth at bedtime.     . Lactobacillus (PROBIOTIC ACIDOPHILUS PO) Take 1 tablet by mouth daily.     . ondansetron (ZOFRAN) 4 MG tablet Take 1 tablet (4 mg total) by mouth every 6 (six) hours. 12 tablet 0  . oxyCODONE-acetaminophen (PERCOCET) 5-325 MG per tablet Take 1 tablet by mouth every 4 (four) hours as needed. 20 tablet 0    Allergies:  Allergies  Allergen Reactions  . Penicillins Hives    Family History  Problem Relation Age of Onset  . Arthritis Mother   . Hyperlipidemia Mother   . Heart disease Mother   . Hypertension Mother   . Diabetes Mother     Social History:  reports that she quit smoking about 38 years ago. Her smoking use included Cigarettes. She quit after 8 years of use. She has never  used smokeless tobacco. She reports that she drinks alcohol. She reports that she does not use illicit drugs.  ROS: A complete review of systems was performed.  All systems are negative except for pertinent findings as noted.  Physical Exam:  Vital signs in last 24 hours: Temp:  [98.7 F (37.1 C)] 98.7 F (37.1 C) (09/30 1305) Pulse Rate:  [58] 58 (09/30 1305) Resp:  [16] 16 (09/30 1305) BP: (134)/(62) 134/62 mmHg (09/30 1305) SpO2:  [100 %] 100 % (09/30 1305) Weight:  [62.37 kg (137 lb 8 oz)] 62.37 kg (137 lb 8 oz) (09/30 1305) General:  Alert and oriented, No acute distress HEENT: Normocephalic, atraumatic Neck: No JVD or lymphadenopathy Cardiovascular: Regular rate and rhythm Lungs: Clear bilaterally Abdomen: Soft, nontender, nondistended, no abdominal masses Back: No CVA tenderness Extremities: No edema Neurologic: Grossly intact  Laboratory Data:  No results found for this or any previous visit (from the past 24 hour(s)). No results found for this or any previous visit (from the past 240 hour(s)). Creatinine: No results for input(s): CREATININE in the last 168 hours.  Radiologic Imaging: No results found.  Impression/Assessment:  Right renal calculi   Plan:  Cysto, left retrograde pyelogram, left ureteroscopy, HLL, extraction of renal cacluli  DAHLSTEDT, STEPHEN M 09/22/2015,  1:27 PM  Lillette Boxer. Dahlstedt MD

## 2015-09-22 NOTE — Anesthesia Preprocedure Evaluation (Addendum)
Anesthesia Evaluation  Patient identified by MRN, date of birth, ID band Patient awake    Reviewed: Allergy & Precautions, NPO status , Patient's Chart, lab work & pertinent test results  History of Anesthesia Complications (+) PONV  Airway Mallampati: II  TM Distance: >3 FB Neck ROM: Full    Dental no notable dental hx.    Pulmonary former smoker,    Pulmonary exam normal breath sounds clear to auscultation       Cardiovascular negative cardio ROS Normal cardiovascular exam Rhythm:Regular Rate:Normal     Neuro/Psych negative neurological ROS  negative psych ROS   GI/Hepatic negative GI ROS, Neg liver ROS,   Endo/Other  negative endocrine ROS  Renal/GU Renal disease  negative genitourinary   Musculoskeletal negative musculoskeletal ROS (+)   Abdominal   Peds negative pediatric ROS (+)  Hematology negative hematology ROS (+)   Anesthesia Other Findings   Reproductive/Obstetrics negative OB ROS                            Anesthesia Physical Anesthesia Plan  ASA: II  Anesthesia Plan: General   Post-op Pain Management:    Induction: Intravenous  Airway Management Planned: LMA  Additional Equipment:   Intra-op Plan:   Post-operative Plan: Extubation in OR  Informed Consent: I have reviewed the patients History and Physical, chart, labs and discussed the procedure including the risks, benefits and alternatives for the proposed anesthesia with the patient or authorized representative who has indicated his/her understanding and acceptance.   Dental advisory given  Plan Discussed with: CRNA  Anesthesia Plan Comments:         Anesthesia Quick Evaluation

## 2015-09-22 NOTE — Op Note (Signed)
PATIENT:  Alyssa Lamb  PRE-OPERATIVE DIAGNOSIS: Left upper ureteral and renal calculi  POST-OPERATIVE DIAGNOSIS: Same  PROCEDURE: Cystoscopy, left retrograde ureteropyelogram with interpretive fluoroscopy, rigid and flexible left ureteroscopy, holmium laser and extraction of left ureteral and renal calculi, placement of 24 cm x 6 French double-J stent-contour, with string  SURGEON:  Lillette Boxer. Dahlstedt, M.D.  ANESTHESIA:  General  EBL:  Minimal  DRAINS: Previously mentioned stent  LOCAL MEDICATIONS USED:  None  SPECIMEN:  Stone fragments  INDICATION: Alyssa Lamb is a 59 year old female with symptomatic left renal stones. The patient recently presented to my office with left flank pain. She has a prior history of urolithiasis. CT revealed a 5-6 mm stone in a calyceal infundibulum with mild hydro-. There were several tiny stones in that same region. 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 All of the symptomatic stones are on the left lower pole. I described treatment options with her-ureteroscopy with laser and extraction versus shockwave lithotripsy. I would suggest we try the ureteroscopy due to several stones within that kidney. She understands, and agrees to proceed. We have also discussed risks and complications. These include but are not limited to injury to the ureter, infection, bleeding, inability to see all the stones. She understands these and agrees to proceed.  Description of procedure: The patient was properly identified and marked (if applicable) in the holding area. They were then  taken to the operating room and placed on the table in a supine position. General anesthesia was then administered. Once fully anesthetized the patient was moved to the dorsolithotomy position and the genitalia and perineum were sterilely prepped and draped in standard fashion. An official timeout was then performed.  A 23 French panendoscope was advanced in her bladder. Bladder  was inspected circumferentially. Single ureteral orifices were normal bilaterally. Bladder was normal without any evidence of tumors trabeculations or foreign bodies. The left ureteral orifice was cannulated with a 6 [place] open-ended catheter.  Retrograde ureteropyelogram was performed. This revealed partial duplication of the left ureter down to the mid ureter. There was a stone at the UPJ of the lower pole ureter. The upper pole pyelocalyceal system was normal. There was the filling defect in the left lower pole UPJ. No other filling defects were seen. There was mild dilation of the left lower pole collecting system.  I then negotiated a guidewire proximally in the ureter. Unfortunately, this always ended up in the upper pole ureteral moiety. I left the guidewire in place after several attempts to cannulate the lower pole ureteral moiety. I then dilated the distal ureter with the inner and then entire 12/14 ureteral access sheath. I then removed the access sheath. The ureteroscope was then brought up the ureter quite easily. I saw the bifurcation of the ureter. I then negotiated the scope into the lower pole ureteral moiety. The guidewire was backed up and then reintroduced into the lower pole ureteral moiety. The scope was then advanced up to the UPJ. The stone was evident. It seemed to be slightly impacted. Because of this, I then fragmented with the holmium laser. The 200  fiber was used to deliver energy at 10 Hz and 0.5 J. Several fragments were obtained. These were grasped with the escape basket and they were then extracted into the bladder quite easily. Several fragments went up into the renal pelvis. I could not grasp these with the rigid scope. I then removed the rigid scope and passed the ureteral access sheath up  to the proximal ureter of the guidewire. The guidewire was removed. I then used the flexible digital, 6 French ureteroscope to inspect the lower pole renal pelvis and calyceal system.  Several small fragments were identified. These were all grasped with the escape basket and easily extracted through the access sheath. All calyceal anatomy was inspected carefully. No further stones were seen. As on the CT scan there were no stones in the left upper pole renal moiety, ureteroscopy was not performed there.  I then removed the flexible ureteroscope. The guidewire was reintroduced through the access sheath into the left lower pole pelvis. Access sheath was removed. Cystoscopically, a 24 cm x 6 French contour double-J stent was placed. Good proximal and distal curls were seen after removal of the guidewire. The bladder was drained. The string was left on the end of the stent, brought through the urethra, and taped to the patient's perineum. There she tolerated the procedure well. She was awakened and then taken to the PACU in stable condition.    PLAN OF CARE: Discharge to home after PACU  PATIENT DISPOSITION:  PACU - hemodynamically stable.

## 2015-09-22 NOTE — Anesthesia Procedure Notes (Signed)
Procedure Name: LMA Insertion Date/Time: 09/22/2015 2:31 PM Performed by: Bethena Roys T Pre-anesthesia Checklist: Patient identified, Emergency Drugs available, Suction available and Patient being monitored Patient Re-evaluated:Patient Re-evaluated prior to inductionOxygen Delivery Method: Circle System Utilized Preoxygenation: Pre-oxygenation with 100% oxygen Intubation Type: IV induction Ventilation: Mask ventilation without difficulty LMA: LMA inserted LMA Size: 4.0 Number of attempts: 1 Airway Equipment and Method: bite block Placement Confirmation: positive ETCO2 Dental Injury: Teeth and Oropharynx as per pre-operative assessment

## 2015-09-22 NOTE — Discharge Instructions (Signed)
POSTOPERATIVE CARE AFTER URETEROSCOPY ° °Stent management ° °*Stents are often left in after ureteroscopy and stone treatment. If left in, they often cause urinary frequency, urgency, occasional blood in the urine, as well as flank discomfort with urination. These are all expected issues, and should resolve after the stent is removed. °*Often times, a small thread is left on the end of the stent, and brought out through the urethra. If so, this is used to remove the stent, making it unnecessary to look in the bladder with a scope in the office to remove the stent. If a thread is left on, did not pull on it until instructed. It is okay to remove the stent on Monday morning. ° °Diet ° °Once you have adequately recovered from anesthesia, you may gradually advance your diet, as tolerated, to your regular diet. ° °Activities ° °You may gradually increase your activities to your normal unrestricted level the day following your procedure. ° °Medications ° °You should resume all preoperative medications. If you are on aspirin-like compounds, you should not resume these until the blood clears from your urine. If given an antibiotic by the surgeon, take these until they are completed. You may also be given, if you have a stent, medications to decrease the urinary frequency and urgency. ° °Pain ° °After ureteroscopy, there may be some pain on the side of the scope. Take your pain medicine for this. Usually, this pain resolves within a day or 2. ° °Fever ° °Please report any fever over 100° to the doctor. ° ° ° ° ° °Post Anesthesia Home Care Instructions ° °Activity: °Get plenty of rest for the remainder of the day. A responsible adult should stay with you for 24 hours following the procedure.  °For the next 24 hours, DO NOT: °-Drive a car °-Operate machinery °-Drink alcoholic beverages °-Take any medication unless instructed by your physician °-Make any legal decisions or sign important papers. ° °Meals: °Start with liquid  foods such as gelatin or soup. Progress to regular foods as tolerated. Avoid greasy, spicy, heavy foods. If nausea and/or vomiting occur, drink only clear liquids until the nausea and/or vomiting subsides. Call your physician if vomiting continues. ° °Special Instructions/Symptoms: °Your throat may feel dry or sore from the anesthesia or the breathing tube placed in your throat during surgery. If this causes discomfort, gargle with warm salt water. The discomfort should disappear within 24 hours. ° °If you had a scopolamine patch placed behind your ear for the management of post- operative nausea and/or vomiting: ° °1. The medication in the patch is effective for 72 hours, after which it should be removed.  Wrap patch in a tissue and discard in the trash. Wash hands thoroughly with soap and water. °2. You may remove the patch earlier than 72 hours if you experience unpleasant side effects which may include dry mouth, dizziness or visual disturbances. °3. Avoid touching the patch. Wash your hands with soap and water after contact with the patch. °  ° °

## 2015-09-22 NOTE — Transfer of Care (Signed)
Immediate Anesthesia Transfer of Care Note  Patient: Alyssa Lamb  Procedure(s) Performed: Procedure(s): CYSTOSCOPY WITH LEFT RETROGRADE PYELOGRAM, URETEROSCOPY, STONE EXTRACTION AND STENT PLACEMENT (Left) HOLMIUM LASER APPLICATION (Left)  Patient Location: PACU  Anesthesia Type:General  Level of Consciousness: awake, alert  and oriented  Airway & Oxygen Therapy: Patient Spontanous Breathing and Patient connected to nasal cannula oxygen  Post-op Assessment: Report given to RN  Post vital signs: Reviewed and stable  Last Vitals:  Filed Vitals:   09/22/15 1305  BP: 134/62  Pulse: 58  Temp: 37.1 C  Resp: 16    Complications: No apparent anesthesia complications

## 2015-09-25 ENCOUNTER — Encounter (HOSPITAL_BASED_OUTPATIENT_CLINIC_OR_DEPARTMENT_OTHER): Payer: Self-pay | Admitting: Urology

## 2015-09-25 NOTE — Anesthesia Postprocedure Evaluation (Signed)
  Anesthesia Post-op Note  Patient: Alyssa Lamb  Procedure(s) Performed: Procedure(s): CYSTOSCOPY WITH LEFT RETROGRADE PYELOGRAM, URETEROSCOPY, STONE EXTRACTION AND STENT PLACEMENT (Left) HOLMIUM LASER APPLICATION (Left)  Patient Location: PACU  Anesthesia Type:General  Level of Consciousness: awake  Airway and Oxygen Therapy: Patient Spontanous Breathing  Post-op Pain: mild  Post-op Assessment: Post-op Vital signs reviewed              Post-op Vital Signs: Reviewed  Last Vitals:  Filed Vitals:   09/22/15 1658  BP: 152/73  Pulse: 53  Temp: 36.5 C  Resp: 12    Complications: No apparent anesthesia complications

## 2015-11-06 ENCOUNTER — Telehealth: Payer: Self-pay | Admitting: Internal Medicine

## 2015-11-06 DIAGNOSIS — N200 Calculus of kidney: Secondary | ICD-10-CM

## 2015-11-06 NOTE — Telephone Encounter (Signed)
Referral placed.

## 2015-11-06 NOTE — Telephone Encounter (Signed)
Patient called regarding urologist visit with dr Diona Fanti. She states that she would like a referral to another office altogether. She also requests a call regarding this. Please follow uop with patient.

## 2016-06-18 ENCOUNTER — Encounter: Payer: Self-pay | Admitting: Internal Medicine

## 2016-06-18 ENCOUNTER — Ambulatory Visit (INDEPENDENT_AMBULATORY_CARE_PROVIDER_SITE_OTHER): Payer: Self-pay | Admitting: Internal Medicine

## 2016-06-18 VITALS — BP 130/68 | HR 58 | Temp 98.3°F | Resp 12 | Ht 64.0 in | Wt 150.0 lb

## 2016-06-18 DIAGNOSIS — M25512 Pain in left shoulder: Secondary | ICD-10-CM

## 2016-06-18 NOTE — Progress Notes (Signed)
Pre visit review using our clinic review tool, if applicable. No additional management support is needed unless otherwise documented below in the visit note. 

## 2016-06-18 NOTE — Progress Notes (Signed)
   Subjective:    Patient ID: Alyssa Lamb, female    DOB: [birth year], 60 y.o.   MRN: [id number]  HPI The patient is a 60 YO female coming in for left shoulder pain. Started about 4 months ago and is gradually worsening. Now is in the neck and the arm as well. Denies injury prior to pain starting. ROM decreased. Tried tylenol and it did not help. 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 [place]. Still with full kidney function.   Review of Systems  Constitutional: Negative for fever, activity change, appetite change and unexpected weight change.  Respiratory: Negative for cough, chest tightness, shortness of breath and wheezing.   Cardiovascular: Negative for chest pain, palpitations and leg swelling.  Gastrointestinal: Negative for nausea, abdominal pain, diarrhea, constipation and abdominal distention.  Musculoskeletal: Positive for arthralgias. Negative for myalgias, back pain and joint swelling.       Shoulder  Skin: Negative.   Neurological: Negative.   Psychiatric/Behavioral: Negative.       Objective:   Physical Exam  Constitutional: She is oriented to person, place, and time. She appears well-developed and well-nourished.  HENT:  Head: Normocephalic and atraumatic.  Eyes: EOM are normal.  Neck: Normal range of motion.  Cardiovascular: Normal rate and regular rhythm.   Pulmonary/Chest: Effort normal and breath sounds normal. No respiratory distress. She has no wheezes. She has no rales.  Abdominal: Soft. Bowel sounds are normal. She exhibits no distension. There is no tenderness. There is no rebound.  Musculoskeletal: She exhibits tenderness. She exhibits no edema.  Pain in the scapular region and in the shoulder and neck left. ROM limited and with pain  Neurological: She is alert and oriented to person, place, and time. Coordination normal.  Skin: Skin is warm and dry.  Psychiatric: She has a normal mood and  affect.   Filed Vitals:   06/18/16 1349  BP: 130/68  Pulse: 58  Temp: 98.3 F (36.8 C)  TempSrc: Oral  Resp: 12  Height: 5\' 4"  (1.626 m)  Weight: 150 lb (68.04 kg)  SpO2: 98%      Assessment & Plan:

## 2016-06-18 NOTE — Patient Instructions (Signed)
We will send you to the orthopedic doctor for the shoulder to find the problem.

## 2016-06-19 DIAGNOSIS — M25512 Pain in left shoulder: Secondary | ICD-10-CM | POA: Insufficient documentation

## 2016-06-19 NOTE — Assessment & Plan Note (Signed)
Refer to orthopedic for evaluation and treatment. She declines treatment at this time. Offered rx for voltaren gel.

## 2016-09-19 ENCOUNTER — Telehealth: Payer: Self-pay | Admitting: Emergency Medicine

## 2016-09-19 DIAGNOSIS — M549 Dorsalgia, unspecified: Secondary | ICD-10-CM

## 2016-09-19 NOTE — Telephone Encounter (Signed)
Left message on voice mail informing patient of referral that was placed for PT.

## 2016-09-19 NOTE — Telephone Encounter (Signed)
I would recommend PT to start with. Have placed that order.

## 2016-09-19 NOTE — Telephone Encounter (Signed)
Pt called and stated she is having pain in her lower back and neck. She is wondering if you can refer her to somebody. She has not had a fall, accident or anything to cause the pain. Thanks.

## 2016-09-20 ENCOUNTER — Telehealth: Payer: Self-pay | Admitting: Internal Medicine

## 2016-09-20 NOTE — Telephone Encounter (Signed)
Patient Name: Alyssa Lamb DOB: 1956-06-17 Initial Comment Caller states she states white of eye turns red if she takes tylenol, motrin, aspirin for more than a day or 2. Has questions. Nurse Assessment Nurse: Ronnald Ramp, RN, Miranda Date/Time (Eastern Time): 09/20/2016 11:46:15 AM Confirm and document reason for call. If symptomatic, describe symptoms. You must click the next button to save text entered. ---Caller states when she takes ASA, Motrin, Tramadol the sclera of her eye turns red. She was seen by eye doctor and told there was bleeding but nothing to worry about. She is wanting to know if that could be related to the medications. Has the patient traveled out of the country within the last 30 days? ---Not Applicable Does the patient have any new or worsening symptoms? ---No Please document clinical information provided and list any resource used. ---Told caller that the NSAIDS are none to causing bleeding and could increase the risk. Did not find any documentation that Tramadol had similar side effects. Suggested caller check with her pharmacy. Also told caller that Tylenol should not cause bleeding and would be safe to take. Guidelines Guideline Title Affirmed Question Affirmed Notes Final Disposition User Clinical Call Ronnald Ramp, RN, [employer]

## 2017-05-07 ENCOUNTER — Encounter: Payer: Self-pay | Admitting: Gynecology

## 2017-08-26 ENCOUNTER — Telehealth: Payer: Self-pay | Admitting: Internal Medicine

## 2017-08-26 ENCOUNTER — Ambulatory Visit (INDEPENDENT_AMBULATORY_CARE_PROVIDER_SITE_OTHER): Payer: Managed Care, Other (non HMO) | Admitting: Internal Medicine

## 2017-08-26 ENCOUNTER — Encounter: Payer: Self-pay | Admitting: Internal Medicine

## 2017-08-26 VITALS — BP 128/82 | HR 68 | Ht 64.0 in | Wt 145.0 lb

## 2017-08-26 DIAGNOSIS — R0789 Other chest pain: Secondary | ICD-10-CM

## 2017-08-26 DIAGNOSIS — R079 Chest pain, unspecified: Secondary | ICD-10-CM

## 2017-08-26 MED ORDER — ASPIRIN 81 MG PO TBEC: 81.0000 mg | DELAYED_RELEASE_TABLET | Freq: Every day | ORAL | 12 refills | Status: DC

## 2017-08-26 NOTE — Assessment & Plan Note (Signed)
Atypical, ? MSK, ecg reviewed with pt, for cxr but declines stress test for now, also to start asa 81 qd,  to f/u any worsening symptoms or concerns

## 2017-08-26 NOTE — Progress Notes (Signed)
Subjective:    Patient ID: Alyssa Lamb, female    DOB: 09-Apr-1956, 61 y.o.   MRN: [id number]   HPI     Here to f/u with c/o left mid and upper Chest "sensation" - not a "pain" o/w hard to characterize except has some association or radiation to the left shoulder and left neck, but not assoc with sob, n/v, diaphoresis, n/v, palp's or dizziness.  Is s/p left shoudler surgury (and right before that) about 1 yr ago, wondering if the sensation could be related to shoulder.  Denies worsening reflux, abd pain, dysphagia, n/v, bowel change or blood. No ST, fever or cough.   Had some wt gain with less activity due to lower back pain.  Wt Readings from Last 3 Encounters:  08/26/17 145 lb (65.8 kg)  06/18/16 150 lb (68 kg)  09/22/15 137 lb 8 oz (62.4 kg)   Past Medical History:  Diagnosis Date  . Arthritis   . Hematuria   . History of kidney stones   . 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 Nephrolithiasis    bilateral  . Urgency of urination   . Wears glasses    Past Surgical History:  Procedure Laterality Date  . CYSTOSCOPY WITH RETROGRADE PYELOGRAM, URETEROSCOPY AND STENT PLACEMENT Left 09/22/2015   Procedure: CYSTOSCOPY WITH LEFT RETROGRADE PYELOGRAM, URETEROSCOPY, STONE EXTRACTION AND STENT PLACEMENT;  Surgeon: Franchot Gallo, MD;  Location: [facility];  Service: Urology;  Laterality: Left;  . HOLMIUM LASER APPLICATION Left 03/25/4741   Procedure: HOLMIUM LASER APPLICATION;  Surgeon: Franchot Gallo, MD;  Location: [facility];  Service: Urology;  Laterality: Left;  . NEPHROLITHOTOMY  1975  . ORIF RIGHT ANKLE FX  1967  . RIGHT SHOULDER SURGERY  2013   repair nerve    reports that she quit smoking about 39 years ago. Her smoking use included Cigarettes. She quit after 8.00 years of use. She has never used smokeless tobacco. She reports that she drinks alcohol. She reports that she does not use drugs. family  history includes Arthritis in her mother; Diabetes in her mother; Heart disease in her mother; Hyperlipidemia in her mother; Hypertension in her mother. Allergies  Allergen Reactions  . Other Rash    Wool causes rash  . Penicillins Hives   Current Outpatient Prescriptions on File Prior to Visit  Medication Sig Dispense Refill  . acetaminophen (TYLENOL) 500 MG tablet Take 1,000 mg by mouth every 6 (six) hours as needed for moderate pain. Reported on 06/18/2016    . beta carotene w/minerals (OCUVITE) tablet Take 1 tablet by mouth daily.    . Cholecalciferol (VITAMIN D) 2000 UNITS tablet Take 2,000 Units by mouth daily.    . Doxylamine Succinate, Sleep, (SLEEP AID PO) Take by mouth at bedtime.     . Lactobacillus (PROBIOTIC ACIDOPHILUS PO) Take 1 tablet by mouth daily.      No current facility-administered medications on file prior to visit.    Review of Systems  Constitutional: Negative for other unusual diaphoresis or sweats HENT: Negative for ear discharge or swelling Eyes: Negative for other worsening visual disturbances Respiratory: Negative for stridor or other swelling  Gastrointestinal: Negative for worsening distension or other blood Genitourinary: Negative for retention or other urinary change Musculoskeletal: Negative for other MSK pain or swelling Skin: Negative for color change or other new lesions Neurological: Negative for worsening tremors and other numbness  Psychiatric/Behavioral: Negative  for worsening agitation or other fatigue All other system neg per pt    Objective:   Physical Exam BP 128/82   Pulse 68   Ht 5\' 4"  (1.626 m)   Wt 145 lb (65.8 kg)   SpO2 99%   BMI 24.89 kg/m  VS noted, not ill appaering Constitutional: Pt appears in NAD HENT: Head: NCAT.  Right Ear: External ear normal.  Left Ear: External ear normal.  Eyes: . Pupils are equal, round, and reactive to light. Conjunctivae and EOM are normal Nose: without d/c or deformity Neck: Neck supple.  Gross normal ROM Cardiovascular: Normal rate and regular rhythm.   Pulmonary/Chest: Effort normal and breath sounds without rales or wheezing.  Abd:  Soft, NT, ND, + BS, no organomegaly Neurological: Pt is alert. At baseline orientation, motor grossly intact Skin: Skin is warm. No rashes, other new lesions, no LE edema Psychiatric: Pt behavior is normal without agitation  No other exam findings  ECG today I have personally interpreted NSR - no ischemic changes    Assessment & Plan:

## 2017-08-26 NOTE — Patient Instructions (Signed)
Please take all new medication as prescribed - the aspirin 81 mg - 1 per day  Please continue all other medications as before, and refills have been done if requested.  Please have the pharmacy call with any other refills you may need.  Please keep your appointments with your specialists as you may have planned  Please go to the XRAY Department in the Basement (go straight as you get off the elevator) for the x-ray testing tomorrow  Please call if you change your mind about the stress test

## 2017-08-26 NOTE — Telephone Encounter (Signed)
Patient Name: Alyssa Lamb  DOB: [birth year]    Initial Comment Caller states she is needing to schedule an appt for the MD. Symptoms: left upper quadrant of chest, rubbing it not helping, been going on for a couple of months, every once in a while going up her neck.    Nurse Assessment  Nurse: Raphael Gibney, RN, Vera Date/Time (Eastern Time): 08/26/2017 1:29:40 PM  Confirm and document reason for call. If symptomatic, describe symptoms. ---Caller states she is having pain above left breast> has had intermittent breast pain for a couple months. Had pain on Sunday and Monday. No pain now. Pain last 15-30 min. No dizziness, SOB.  Does the patient have any new or worsening symptoms? ---Yes  Will a triage be completed? ---Yes  Related visit to physician within the last 2 weeks? ---No  Does the PT have any chronic conditions? (i.e. diabetes, asthma, etc.) ---No  Is this a behavioral health or substance abuse call? ---No     Guidelines    Guideline Title Affirmed Question Affirmed Notes  Chest Pain [1] Chest pain lasts > 5 minutes AND [2] occurred > 3 days ago (72 hours) AND [3] NO chest pain or cardiac symptoms now    Final Disposition User   See Physician within 24 Hours [place], RN, [place]    Comments  appt scheduled for 08/26/2017 at 5:30 pm with Dr. Cathlean Cower   Referrals  REFERRED TO PCP OFFICE   Disagree/Comply: Comply

## 2017-08-28 ENCOUNTER — Ambulatory Visit (INDEPENDENT_AMBULATORY_CARE_PROVIDER_SITE_OTHER)
Admission: RE | Admit: 2017-08-28 | Discharge: 2017-08-28 | Disposition: A | Discharge status: Home / Self Care | Payer: Managed Care, Other (non HMO) | Source: Ambulatory Visit | Attending: Internal Medicine | Admitting: Internal Medicine

## 2017-08-28 DIAGNOSIS — R0789 Other chest pain: Secondary | ICD-10-CM

## 2018-03-09 ENCOUNTER — Encounter: Payer: Self-pay | Admitting: Internal Medicine

## 2018-03-09 ENCOUNTER — Other Ambulatory Visit (INDEPENDENT_AMBULATORY_CARE_PROVIDER_SITE_OTHER): Payer: Managed Care, Other (non HMO)

## 2018-03-09 ENCOUNTER — Ambulatory Visit (INDEPENDENT_AMBULATORY_CARE_PROVIDER_SITE_OTHER): Payer: Managed Care, Other (non HMO) | Admitting: Internal Medicine

## 2018-03-09 VITALS — BP 146/90 | HR 57 | Temp 98.2°F | Ht 64.0 in | Wt 150.0 lb

## 2018-03-09 DIAGNOSIS — Q639 Congenital malformation of kidney, unspecified: Secondary | ICD-10-CM

## 2018-03-09 DIAGNOSIS — Z1159 Encounter for screening for other viral diseases: Secondary | ICD-10-CM

## 2018-03-09 DIAGNOSIS — Z0000 Encounter for general adult medical examination without abnormal findings: Secondary | ICD-10-CM

## 2018-03-09 LAB — COMPREHENSIVE METABOLIC PANEL
ALT: 12 U/L (ref 0–35)
AST: 18 U/L (ref 0–37)
Albumin: 4.5 g/dL (ref 3.5–5.2)
Alkaline Phosphatase: 76 U/L (ref 39–117)
BUN: 17 mg/dL (ref 6–23)
CHLORIDE: 101 meq/L (ref 96–112)
CO2: 29 mEq/L (ref 19–32)
Calcium: 9.6 mg/dL (ref 8.4–10.5)
Creatinine, Ser: 0.85 mg/dL (ref 0.40–1.20)
GFR: 71.58 mL/min (ref >60.00)
GLUCOSE: 98 mg/dL (ref 70–99)
POTASSIUM: 3.7 meq/L (ref 3.5–5.1)
SODIUM: 138 meq/L (ref 135–145)
Total Bilirubin: 0.6 mg/dL (ref 0.2–1.2)
Total Protein: 7.7 g/dL (ref 6.0–8.3)

## 2018-03-09 LAB — LIPID PANEL
CHOLESTEROL: 229 mg/dL — AB (ref 0–200)
HDL: 66.8 mg/dL (ref >39.00)
LDL CALC: 151 mg/dL — AB (ref 0–99)
NONHDL: 162.44
Total CHOL/HDL Ratio: 3
Triglycerides: 57 mg/dL (ref 0.0–149.0)
VLDL: 11.4 mg/dL (ref 0.0–40.0)

## 2018-03-09 LAB — CBC
HEMATOCRIT: 42 % (ref 36.0–46.0)
Hemoglobin: 14.3 g/dL (ref 12.0–15.0)
MCHC: 34.1 g/dL (ref 30.0–36.0)
MCV: 88.5 fl (ref 78.0–100.0)
Platelets: 211 10*3/uL (ref 150.0–400.0)
RBC: 4.74 Mil/uL (ref 3.87–5.11)
RDW: 13.8 % (ref 11.5–15.5)
WBC: 4 10*3/uL (ref 4.0–10.5)

## 2018-03-09 LAB — HEMOGLOBIN A1C: HEMOGLOBIN A1C: 5.6 % (ref 4.6–6.5)

## 2018-03-09 NOTE — Patient Instructions (Addendum)
We are putting you on the list for shingles vaccine.   We will have you monitor the blood pressure at least 2-3 times per month at home. Call us or come back if they are running more than 140/90 consistently.   We are checking the labs. You should get another pap smear.   We have ordered the mammogram for you at the breast center.   Health Maintenance, Female Adopting a healthy lifestyle and getting preventive care can go a long way to promote health and wellness. Talk with your health care provider about what schedule of regular examinations is right for you. This is a good chance for you to check in with your provider about disease prevention and staying healthy. In between checkups, there are plenty of things you can do on your own. Experts have done a lot of research about which lifestyle changes and preventive measures are most likely to keep you healthy. Ask your health care provider for more information. Weight and diet Eat a healthy diet  Be sure to include plenty of vegetables, fruits, low-fat dairy products, and lean protein.  Do not eat a lot of foods high in solid fats, added sugars, or salt.  Get regular exercise. This is one of the most important things you can do for your health. ? Most adults should exercise for at least 150 minutes each week. The exercise should increase your heart rate and make you sweat (moderate-intensity exercise). ? Most adults should also do strengthening exercises at least twice a week. This is in addition to the moderate-intensity exercise.  Maintain a healthy weight  Body mass index (BMI) is a measurement that can be used to identify possible weight problems. It estimates body fat based on height and weight. Your health care provider can help determine your BMI and help you achieve or maintain a healthy weight.  For females 33 years of age and older: ? A BMI below 18.5 is considered underweight. ? A BMI of 18.5 to 24.9 is normal. ? A BMI of 25  to 29.9 is considered overweight. ? A BMI of 30 and above is considered obese.  Watch levels of cholesterol and blood lipids  You should start having your blood tested for lipids and cholesterol at 64 years of age, then have this test every 5 years.  You may need to have your cholesterol levels checked more often if: ? Your lipid or cholesterol levels are high. ? You are older than 64 years of age. ? You are at high risk for heart disease.  Cancer screening Lung Cancer  Lung cancer screening is recommended for adults 6-48 years old who are at high risk for lung cancer because of a history of smoking.  A yearly low-dose CT scan of the lungs is recommended for people who: ? Currently smoke. ? Have quit within the past 15 years. ? Have at least a 30-pack-year history of smoking. A pack year is smoking an average of one pack of cigarettes a day for 1 year.  Yearly screening should continue until it has been 15 years since you quit.  Yearly screening should stop if you develop a health problem that would prevent you from having lung cancer treatment.  Breast Cancer  Practice breast self-awareness. This means understanding how your breasts normally appear and feel.  It also means doing regular breast self-exams. Let your health care provider know about any changes, no matter how small.  If you are in your 20s or 30s, you  should have a clinical breast exam (CBE) by a health care provider every 1-3 years as part of a regular health exam.  If you are 12 or older, have a CBE every year. Also consider having a breast X-ray (mammogram) every year.  If you have a family history of breast cancer, talk to your health care provider about genetic screening.  If you are at high risk for breast cancer, talk to your health care provider about having an MRI and a mammogram every year.  Breast cancer gene (BRCA) assessment is recommended for women who have family members with BRCA-related cancers.  BRCA-related cancers include: ? Breast. ? Ovarian. ? Tubal. ? Peritoneal cancers.  Results of the assessment will determine the need for genetic counseling and BRCA1 and BRCA2 testing.  Cervical Cancer Your health care provider may recommend that you be screened regularly for cancer of the pelvic organs (ovaries, uterus, and vagina). This screening involves a pelvic examination, including checking for microscopic changes to the surface of your cervix (Pap test). You may be encouraged to have this screening done every 3 years, beginning at age 66.  For women ages 65-65, health care providers may recommend pelvic exams and Pap testing every 3 years, or they may recommend the Pap and pelvic exam, combined with testing for human papilloma virus (HPV), every 5 years. Some types of HPV increase your risk of cervical cancer. Testing for HPV may also be done on women of any age with unclear Pap test results.  Other health care providers may not recommend any screening for nonpregnant women who are considered low risk for pelvic cancer and who do not have symptoms. Ask your health care provider if a screening pelvic exam is right for you.  If you have had past treatment for cervical cancer or a condition that could lead to cancer, you need Pap tests and screening for cancer for at least 20 years after your treatment. If Pap tests have been discontinued, your risk factors (such as having a new sexual partner) need to be reassessed to determine if screening should resume. Some women have medical problems that increase the chance of getting cervical cancer. In these cases, your health care provider may recommend more frequent screening and Pap tests.  Colorectal Cancer  This type of cancer can be detected and often prevented.  Routine colorectal cancer screening usually begins at 64 years of age and continues through 64 years of age.  Your health care provider may recommend screening at an earlier age if  you have risk factors for colon cancer.  Your health care provider may also recommend using home test kits to check for hidden blood in the stool.  A small camera at the end of a tube can be used to examine your colon directly (sigmoidoscopy or colonoscopy). This is done to check for the earliest forms of colorectal cancer.  Routine screening usually begins at age 43.  Direct examination of the colon should be repeated every 5-10 years through 64 years of age. However, you may need to be screened more often if early forms of precancerous polyps or small growths are found.  Skin Cancer  Check your skin from head to toe regularly.  Tell your health care provider about any new moles or changes in moles, especially if there is a change in a mole's shape or color.  Also tell your health care provider if you have a mole that is larger than the size of a pencil eraser.  Always use sunscreen. Apply sunscreen liberally and repeatedly throughout the day.  Protect yourself by wearing long sleeves, pants, a wide-brimmed hat, and sunglasses whenever you are outside.  Heart disease, diabetes, and high blood pressure  High blood pressure causes heart disease and increases the risk of stroke. High blood pressure is more likely to develop in: ? People who have blood pressure in the high end of the normal range (130-139/85-89 mm Hg). ? People who are overweight or obese. ? People who are African American.  If you are 75-54 years of age, have your blood pressure checked every 3-5 years. If you are 31 years of age or older, have your blood pressure checked every year. You should have your blood pressure measured twice-once when you are at a hospital or clinic, and once when you are not at a hospital or clinic. Record the average of the two measurements. To check your blood pressure when you are not at a hospital or clinic, you can use: ? An automated blood pressure machine at a pharmacy. ? A home blood  pressure monitor.  If you are between 76 years and 71 years old, ask your health care provider if you should take aspirin to prevent strokes.  Have regular diabetes screenings. This involves taking a blood sample to check your fasting blood sugar level. ? If you are at a normal weight and have a low risk for diabetes, have this test once every three years after 64 years of age. ? If you are overweight and have a high risk for diabetes, consider being tested at a younger age or more often. Preventing infection Hepatitis B  If you have a higher risk for hepatitis B, you should be screened for this virus. You are considered at high risk for hepatitis B if: ? You were born in a country where hepatitis B is common. Ask your health care provider which countries are considered high risk. ? Your parents were born in a high-risk country, and you have not been immunized against hepatitis B (hepatitis B vaccine). ? You have HIV or AIDS. ? You use needles to inject street drugs. ? You live with someone who has hepatitis B. ? You have had sex with someone who has hepatitis B. ? You get hemodialysis treatment. ? You take certain medicines for conditions, including cancer, organ transplantation, and autoimmune conditions.  Hepatitis C  Blood testing is recommended for: ? Everyone born from [birth year] through 1967. ? Anyone with known risk factors for hepatitis C.  Sexually transmitted infections (STIs)  You should be screened for sexually transmitted infections (STIs) including gonorrhea and chlamydia if: ? You are sexually active and are younger than 64 years of age. ? You are older than 64 years of age and your health care provider tells you that you are at risk for this type of infection. ? Your sexual activity has changed since you were last screened and you are at an increased risk for chlamydia or gonorrhea. Ask your health care provider if you are at risk.  If you do not have HIV, but are at risk,  it may be recommended that you take a prescription medicine daily to prevent HIV infection. This is called pre-exposure prophylaxis (PrEP). You are considered at risk if: ? You are sexually active and do not regularly use condoms or know the HIV status of your partner(s). ? You take drugs by injection. ? You are sexually active with a partner who has HIV.  Talk with  your health care provider about whether you are at high risk of being infected with HIV. If you choose to begin PrEP, you should first be tested for HIV. You should then be tested every 3 months for as long as you are taking PrEP. Pregnancy  If you are premenopausal and you may become pregnant, ask your health care provider about preconception counseling.  If you may become pregnant, take 400 to 800 micrograms (mcg) of folic acid every day.  If you want to prevent pregnancy, talk to your health care provider about birth control (contraception). Osteoporosis and menopause  Osteoporosis is a disease in which the bones lose minerals and strength with aging. This can result in serious bone fractures. Your risk for osteoporosis can be identified using a bone density scan.  If you are 30 years of age or older, or if you are at risk for osteoporosis and fractures, ask your health care provider if you should be screened.  Ask your health care provider whether you should take a calcium or vitamin D supplement to lower your risk for osteoporosis.  Menopause may have certain physical symptoms and risks.  Hormone replacement therapy may reduce some of these symptoms and risks. Talk to your health care provider about whether hormone replacement therapy is right for you. Follow these instructions at home:  Schedule regular health, dental, and eye exams.  Stay current with your immunizations.  Do not use any tobacco products including cigarettes, chewing tobacco, or electronic cigarettes.  If you are pregnant, do not drink  alcohol.  If you are breastfeeding, limit how much and how often you drink alcohol.  Limit alcohol intake to no more than 1 drink per day for nonpregnant women. One drink equals 12 ounces of beer, 5 ounces of wine, or 1 ounces of hard liquor.  Do not use street drugs.  Do not share needles.  Ask your health care provider for help if you need support or information about quitting drugs.  Tell your health care provider if you often feel depressed.  Tell your health care provider if you have ever been abused or do not feel safe at home. This information is not intended to replace advice given to you by your health care provider. Make sure you discuss any questions you have with your health care provider. Document Released: 06/24/2011 Document Revised: 05/16/2016 Document Reviewed: 09/12/2015 Elsevier Interactive Patient Education  [employer].

## 2018-03-09 NOTE — Assessment & Plan Note (Addendum)
Checking CMP and adjust as needed. BP elevated today and asked her to monitor at home.

## 2018-03-09 NOTE — Assessment & Plan Note (Addendum)
Pap smear needed and she will return to gyn. Mammogram ordered. Tetanus likely up to date and she will check on the year. Flu shot up to date. Checking hep c screening. Declines hiv screening. Colon screening due next year. Screening recommendations given.

## 2018-03-09 NOTE — Progress Notes (Signed)
   Subjective:    Patient ID: Alyssa Lamb, female    DOB: [birth year], 62 y.o.   MRN: [id number]  HPI The patient is a 62 YO female coming in for physical. Has congenital kidney abnormality with normal function. Not on prescription meds.   PMH, [facility], social history reviewed and updated.   Review of Systems  Constitutional: Negative.   HENT: Negative.   Eyes: Negative.   Respiratory: Negative for cough, chest tightness and shortness of breath.   Cardiovascular: Negative for chest pain, palpitations and leg swelling.  Gastrointestinal: Negative for abdominal distention, abdominal pain, constipation, diarrhea, nausea and vomiting.  Musculoskeletal: Negative.   Skin: Negative.   Neurological: Negative.   Psychiatric/Behavioral: Negative.       Objective:   Physical Exam  Constitutional: She is oriented to person, place, and time. She appears well-developed and well-nourished.  HENT:  Head: Normocephalic and atraumatic.  Eyes: EOM are normal.  Neck: Normal range of motion.  Cardiovascular: Normal rate and regular rhythm.  Pulmonary/Chest: Effort normal and breath sounds normal. No respiratory distress. She has no wheezes. She has no rales.  Abdominal: Soft. Bowel sounds are normal. She exhibits no distension. There is no tenderness. There is no rebound.  Musculoskeletal: She exhibits no edema.  Neurological: She is alert and oriented to person, place, and time. Coordination normal.  Skin: Skin is warm and dry.  Psychiatric: She has a normal mood and affect.   Vitals:   03/09/18 1031 03/09/18 1113  BP: (!) 150/100 (!) 146/90  Pulse: (!) 57   Temp: 98.2 F (36.8 C)   TempSrc: Oral   SpO2: 98%   Weight: 150 lb (68 kg)   Height: 5\' 4"  (1.626 m)       Assessment & Plan:

## 2018-03-10 LAB — HEPATITIS C ANTIBODY
HEP C AB: NONREACTIVE
SIGNAL TO CUT-OFF: 0.01 (ref <1.00)

## 2018-04-17 ENCOUNTER — Other Ambulatory Visit: Payer: Self-pay | Admitting: Internal Medicine

## 2018-04-17 DIAGNOSIS — Z1231 Encounter for screening mammogram for malignant neoplasm of breast: Secondary | ICD-10-CM

## 2018-04-22 ENCOUNTER — Telehealth: Payer: Self-pay

## 2018-04-22 NOTE — Telephone Encounter (Signed)
Left message asking patient to call back to schedule nurse visit to get first shingrix vaccine---can talk with tamara,RN at [facility] if any further questions 

## 2018-04-23 ENCOUNTER — Ambulatory Visit: Payer: Self-pay

## 2018-04-23 NOTE — Telephone Encounter (Signed)
Patient called, left VM to return call to the office if she still has concerns about the shingles vaccine.

## 2018-04-24 ENCOUNTER — Ambulatory Visit (INDEPENDENT_AMBULATORY_CARE_PROVIDER_SITE_OTHER): Payer: Managed Care, Other (non HMO) | Admitting: *Deleted

## 2018-04-24 DIAGNOSIS — Z23 Encounter for immunization: Secondary | ICD-10-CM | POA: Diagnosis not present

## 2018-05-08 ENCOUNTER — Ambulatory Visit: Payer: Managed Care, Other (non HMO)

## 2018-05-26 ENCOUNTER — Ambulatory Visit: Payer: Self-pay

## 2018-05-26 NOTE — Telephone Encounter (Signed)
Pt c/o blood in stools. Onset was 5 days ago. Amount is estimated at 1/4 to 1/2 cup in amount and the toilet water turns pink. She has had loose stools and has 1 per day. Denies constipation and states she has never had blood in stools before. Denies any other symptoms. Appt given per protocol and care advice given.   Reason for Disposition . MODERATE rectal bleeding (small blood clots, passing blood without stool, or toilet water turns red)  Answer Assessment - Initial Assessment Questions 1. APPEARANCE of BLOOD: "What color is it?" "Is it passed separately, on the surface of the stool, or mixed in with the stool?"      Bright red mixed in with stool 2. AMOUNT: "How much blood was passed?"      1/3 to 1/4 cup 3. FREQUENCY: "How many times has blood been passed with the stools?"      Every stool for 5 days 4. ONSET: "When was the blood first seen in the stools?" (Days or weeks)      5 days ago 5. DIARRHEA: "Is there also some diarrhea?" If so, ask: "How many diarrhea stools were passed in past 24 hours?"      Loose not diarrhea per pt- 1 stool in a 24 hour period 6. CONSTIPATION: "Do you have constipation?" If so, "How bad is it?"     no 7. RECURRENT SYMPTOMS: "Have you had blood in your stools before?" If so, ask: "When was the last time?" and "What happened that time?"      no 8. BLOOD THINNERS: "Do you take any blood thinners?" (e.g., Coumadin/warfarin, Pradaxa/dabigatran, aspirin)     no 9. OTHER SYMPTOMS: "Do you have any other symptoms?"  (e.g., abdominal pain, vomiting, dizziness, fever)     no 10. PREGNANCY: "Is there any chance you are pregnant?" "When was your last menstrual period?"       n/a  Protocols used: RECTAL BLEEDING-A-AH

## 2018-05-27 ENCOUNTER — Encounter: Payer: Self-pay | Admitting: Family

## 2018-05-27 ENCOUNTER — Ambulatory Visit: Payer: Managed Care, Other (non HMO) | Admitting: Family

## 2018-05-27 ENCOUNTER — Other Ambulatory Visit (INDEPENDENT_AMBULATORY_CARE_PROVIDER_SITE_OTHER): Payer: Managed Care, Other (non HMO)

## 2018-05-27 ENCOUNTER — Encounter: Payer: Self-pay | Admitting: Gastroenterology

## 2018-05-27 VITALS — BP 138/76 | HR 57 | Temp 97.8°F | Ht 64.0 in | Wt 145.0 lb

## 2018-05-27 DIAGNOSIS — K625 Hemorrhage of anus and rectum: Secondary | ICD-10-CM | POA: Diagnosis not present

## 2018-05-27 LAB — CBC WITH DIFFERENTIAL/PLATELET
BASOS ABS: 0 10*3/uL (ref 0.0–0.1)
Basophils Relative: 0.2 % (ref 0.0–3.0)
EOS ABS: 0.3 10*3/uL (ref 0.0–0.7)
Eosinophils Relative: 5 % (ref 0.0–5.0)
HEMATOCRIT: 44.1 % (ref 36.0–46.0)
Hemoglobin: 14.8 g/dL (ref 12.0–15.0)
LYMPHS PCT: 39.4 % (ref 12.0–46.0)
Lymphs Abs: 2.3 10*3/uL (ref 0.7–4.0)
MCHC: 33.6 g/dL (ref 30.0–36.0)
MCV: 89.9 fl (ref 78.0–100.0)
Monocytes Absolute: 0.5 10*3/uL (ref 0.1–1.0)
Monocytes Relative: 8.1 % (ref 3.0–12.0)
NEUTROS ABS: 2.7 10*3/uL (ref 1.4–7.7)
Neutrophils Relative %: 47.3 % (ref 43.0–77.0)
Platelets: 226 10*3/uL (ref 150.0–400.0)
RBC: 4.9 Mil/uL (ref 3.87–5.11)
RDW: 14.5 % (ref 11.5–15.5)
WBC: 5.8 10*3/uL (ref 4.0–10.5)

## 2018-05-27 NOTE — Progress Notes (Signed)
Alyssa Lamb is a 62 y.o. female with the following history as recorded in EpicCare:  Patient Active Problem List   Diagnosis Date Noted  . Left shoulder pain 06/19/2016  . Kidney anomaly, congenital 09/07/2015  . Routine general medical examination at a health care facility 09/07/2015    Current Outpatient Medications  Medication Sig Dispense Refill  . Acetaminophen (TYLENOL) 325 MG CAPS      No current facility-administered medications for this visit.     Allergies: Other and Penicillins  Past Medical History:  Diagnosis Date  . Arthritis   . Hematuria   . History of kidney stones   . 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 Nephrolithiasis    bilateral  . Urgency of urination   . Wears glasses     Past Surgical History:  Procedure Laterality Date  . CYSTOSCOPY WITH RETROGRADE PYELOGRAM, URETEROSCOPY AND STENT PLACEMENT Left 09/22/2015   Procedure: CYSTOSCOPY WITH LEFT RETROGRADE PYELOGRAM, URETEROSCOPY, STONE EXTRACTION AND STENT PLACEMENT;  Surgeon: Franchot Gallo, MD;  Location: [facility];  Service: Urology;  Laterality: Left;  . HOLMIUM LASER APPLICATION Left 7/61/9509   Procedure: HOLMIUM LASER APPLICATION;  Surgeon: Franchot Gallo, MD;  Location[facility];  Service: Urology;  Laterality: Left;  . NEPHROLITHOTOMY  1975  . ORIF RIGHT ANKLE FX  1967  . RIGHT SHOULDER SURGERY  2013   repair nerve    Family History  Problem Relation Age of Onset  . Arthritis Mother   . Hyperlipidemia Mother   . Heart disease Mother   . Hypertension Mother   . Diabetes Mother     Social History   Tobacco Use  . Smoking status: Former Smoker    Years: 8.00    Types: Cigarettes    Last attempt to quit: 09/18/1977    Years since quitting: 40.7  . Smokeless tobacco: Never Used  Substance Use Topics  . Alcohol use: Yes    Alcohol/week: 0.0 oz    Comment: occasional    Subjective:  Patient presents  with concerns for rectal bleeding x 5-6 days; does have history of internal hemorrhoids; + bright red blood; notes that symptoms seemed to have resolved as of this morning; last colonoscopy done in 2015 at [place]; admits she went to an [place] wedding over the weekend and admits that food was "very spicy" but symptoms were prior to going to the wedding;  Denies any sense of constipation/ "tearing" sensation with recent bowel movements; denies any abdominal pain;   Objective:  Vitals:   05/27/18 0827  BP: 138/76  Pulse: (!) 57  Temp: 97.8 F (36.6 C)  TempSrc: Oral  SpO2: 97%  Weight: 145 lb (65.8 kg)  Height: 5\' 4"  (1.626 m)    General: Well developed, well nourished, in no acute distress  Skin : Warm and dry.  Head: Normocephalic and atraumatic  Lungs: Respirations unlabored; clear to auscultation bilaterally without wheeze, rales, rhonchi  CVS exam: normal rate and regular rhythm.  Abdomen: Soft; nontender; nondistended; normoactive bowel sounds; no masses or hepatosplenomegaly  Neurologic: Alert and oriented; speech intact; face symmetrical; moves all extremities well; CNII-XII intact without focal deficit   Rectal exam is deferred today Assessment:  1. Rectal bleeding     Plan:  Update labs today; refer to GI for colonoscopy; follow-up to be determined.  No follow-ups on file.  Orders Placed This Encounter  Procedures  . CBC  w/Diff    Standing Status:   Future    Standing Expiration Date:   05/27/2019  . Iron, TIBC and Ferritin Panel    Standing Status:   Future    Standing Expiration Date:   05/28/2019  . Ambulatory referral to Gastroenterology    Referral Priority:   Routine    Referral Type:   Consultation    Referral Reason:   Specialty Services Required    Number of Visits Requested:   1    Requested Prescriptions    No prescriptions requested or ordered in this encounter

## 2018-05-28 LAB — IRON,TIBC AND FERRITIN PANEL
Ferritin: 107 ng/mL (ref 15–150)
Iron Saturation: 35 % (ref 15–55)
Iron: 96 ug/dL (ref 27–139)
TIBC: 272 ug/dL (ref 250–450)
UIBC: 176 ug/dL (ref 118–369)

## 2018-06-02 ENCOUNTER — Ambulatory Visit: Payer: Managed Care, Other (non HMO) | Admitting: Gastroenterology

## 2018-06-02 ENCOUNTER — Encounter: Payer: Self-pay | Admitting: Gastroenterology

## 2018-06-02 VITALS — BP 124/70 | HR 67 | Ht 64.17 in | Wt 144.0 lb

## 2018-06-02 DIAGNOSIS — K625 Hemorrhage of anus and rectum: Secondary | ICD-10-CM | POA: Diagnosis not present

## 2018-06-02 MED ORDER — PEG-KCL-NACL-NASULF-NA ASC-C 140 G PO SOLR: 140.0000 g | ORAL | 0 refills | Status: DC

## 2018-06-02 NOTE — Patient Instructions (Signed)
If you are age 64 or older, your body mass index should be between 23-30. Your Body mass index is 23.96 kg/m. If this is out of the aforementioned range listed, please consider follow up with your Primary Care Provider.  If you are age 16 or younger, your body mass index should be between 19-25. Your Body mass index is 23.96 kg/m. If this is out of the aformentioned range listed, please consider follow up with your Primary Care Provider.   You have been scheduled for a colonoscopy. Please follow written instructions given to you at your visit today.  Please pick up your prep supplies at the pharmacy within the next 1-3 days. If you use inhalers (even only as needed), please bring them with you on the day of your procedure. Your physician has requested that you go to www.startemmi.com and enter the access code given to you at your visit today. This web site gives a general overview about your procedure. However, you should still follow specific instructions given to you by our office regarding your preparation for the procedure.  It was a pleasure to meet you today!  Dr. Loletha Carrow

## 2018-06-02 NOTE — Progress Notes (Signed)
[facility] Consult Note:  History: Alyssa Lamb 06/02/2018  Referring physician: Hoyt Koch, MD  Reason for consult/chief complaint: Blood In Stools (for 5 days; BR in toilet, resolved as of 05-24-18); Hemorrhoids; Constipation (comes and goes,  typically has a daily BM, no straining); and Colonoscopy (May 2015 with [facility]; no polyps)   Subjective  HPI:  This is a delightful 62 year old woman referred by primary care for recent episode of painless rectal bleeding.  She had not previously had bleeding.  There were for 5 days of painless rectal bleeding without constipation, diarrhea or anorectal pain.  She does not have chronic abdominal pain, nausea, vomiting, dysphagia, odynophagia, anorexia or weight loss. [name] thought her last colonoscopy had been in 2015 with [facility] in [facility], but there is no record of it in care everywhere.  She then reported it may have been as far back as 2010. She has not had bleeding since that episode about 10 days ago.  ROS:  Review of Systems  Constitutional: Negative for appetite change and unexpected weight change.  HENT: Negative for mouth sores and voice change.   Eyes: Negative for pain and redness.  Respiratory: Negative for cough and shortness of breath.   Cardiovascular: Negative for chest pain and palpitations.  Genitourinary: Negative for dysuria and hematuria.       Recurrent kidney stones reportedly related to her congenital duplicated kidneys  Musculoskeletal: Negative for arthralgias and myalgias.  Skin: Negative for pallor and rash.  Neurological: Negative for weakness and headaches.  Hematological: Negative for adenopathy.     Past Medical History: Past Medical History:  Diagnosis Date  . Arthritis   . Hematuria   . History of kidney stones   . 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 Nephrolithiasis    bilateral  . Urgency of urination   . Wears  glasses      Past Surgical History: Past Surgical History:  Procedure Laterality Date  . CYSTOSCOPY WITH RETROGRADE PYELOGRAM, URETEROSCOPY AND STENT PLACEMENT Left 09/22/2015   Procedure: CYSTOSCOPY WITH LEFT RETROGRADE PYELOGRAM, URETEROSCOPY, STONE EXTRACTION AND STENT PLACEMENT;  Surgeon: Franchot Gallo, MD;  Location: [facility];  Service: Urology;  Laterality: Left;  . HOLMIUM LASER APPLICATION Left 8/65/7846   Procedure: HOLMIUM LASER APPLICATION;  Surgeon: Franchot Gallo, MD;  Location: [facility];  Service: Urology;  Laterality: Left;  . NEPHROLITHOTOMY  1975  . ORIF RIGHT ANKLE FX  1967  . RIGHT SHOULDER SURGERY  2013   repair nerve  . TUBAL LIGATION  1986     Family History: Family History  Problem Relation Age of Onset  . Arthritis Mother   . Hyperlipidemia Mother   . Heart disease Mother   . Hypertension Mother   . Diabetes Mother   . Colon cancer Neg Hx   . Esophageal cancer Neg Hx   . Stomach cancer Neg Hx     Social History: Social History   Socioeconomic History  . Marital status: Married    Spouse name: Not on file  . Number of children: Not on file  . Years of education: Not on file  . Highest education level: Not on file  Occupational History  . Not on file  Social Needs  . Financial resource strain: Not on file  . Food insecurity:    Worry: Not on file    Inability: Not on file  . Transportation  needs:    Medical: Not on file    Non-medical: Not on file  Tobacco Use  . Smoking status: Former Smoker    Years: 8.00    Types: Cigarettes    Last attempt to quit: 09/18/1977    Years since quitting: 40.7  . Smokeless tobacco: Never Used  Substance and Sexual Activity  . Alcohol use: Yes    Alcohol/week: 0.0 oz    Comment: occasional  . Drug use: No  . Sexual activity: Not on file  Lifestyle  . Physical activity:    Days per week: Not on file    Minutes per session: Not on file  . Stress: Not on file   Relationships  . Social connections:    Talks on phone: Not on file    Gets together: Not on file    Attends religious service: Not on file    Active member of club or organization: Not on file    Attends meetings of clubs or organizations: Not on file    Relationship status: Not on file  Other Topics Concern  . Not on file  Social History Narrative  . Not on file    Allergies: Allergies  Allergen Reactions  . Other Rash    Wool causes rash  . Penicillins Hives    Outpatient Meds: Current Outpatient Medications  Medication Sig Dispense Refill  . Acetaminophen (TYLENOL) 325 MG CAPS daily as needed.     . diphenhydrAMINE-APAP, sleep, 25-500 MG CAPS Take by mouth at bedtime.    . multivitamin-lutein (OCUVITE-LUTEIN) CAPS capsule Take 1 capsule by mouth daily.     No current facility-administered medications for this visit.       ___________________________________________________________________ Objective   Exam:  BP 124/70   Pulse 67   Ht 5\' 5"  (1.651 m)   Wt 144 lb (65.3 kg)   BMI 23.96 kg/m    General: this is a(n) well-appearing woman  Eyes: sclera anicteric, no redness  ENT: oral mucosa moist without lesions, no cervical or supraclavicular lymphadenopathy, good dentition  CV: RRR without murmur, S1/S2, no JVD, no peripheral edema  Resp: clear to auscultation bilaterally, normal RR and effort noted  GI: soft, no tenderness, with active bowel sounds. No guarding or palpable organomegaly noted.  Skin; warm and dry, no rash or jaundice noted  Neuro: awake, alert and oriented x 3. Normal gross motor function and fluent speech Rectal: Normal external, normal resting sphincter tone, no tenderness, no palpable internal lesions, scant stool in rectal vault.  Labs:  CBC Latest Ref Rng & Units 05/27/2018 03/09/2018 09/12/2015  WBC 4.0 - 10.5 K/uL 5.8 4.0 3.9(L)  Hemoglobin 12.0 - 15.0 g/dL 14.8 14.3 13.1  Hematocrit 36.0 - 46.0 % 44.1 42.0 38.4  Platelets  150.0 - 400.0 K/uL 226.0 211.0 180   Normal iron studies   Assessment: Encounter Diagnosis  Name Primary?  . Rectal bleeding Yes    This seems most likely to have been hemorrhoidal, though she was not having a period of altered bowel habits when it occurred.  She has had occasional constipation over the years, but she treats this easily with diet.  It seems to have been many years since her last colonoscopy  Plan:  Colonoscopy.  She is agreeable after discussion of procedure and risks. In the meantime, she will use OTC Preparation H suppository as needed for recurrent bleeding.  Thank you for the courtesy of this consult.  Please call me with any questions or concerns.  Nelida Meuse III  CC: Hoyt Koch, MD

## 2018-06-03 ENCOUNTER — Encounter: Payer: Self-pay | Admitting: Gastroenterology

## 2018-06-11 ENCOUNTER — Encounter: Payer: Self-pay | Admitting: Gastroenterology

## 2018-06-11 ENCOUNTER — Ambulatory Visit (AMBULATORY_SURGERY_CENTER): Payer: Managed Care, Other (non HMO) | Admitting: Gastroenterology

## 2018-06-11 ENCOUNTER — Other Ambulatory Visit: Payer: Self-pay

## 2018-06-11 VITALS — BP 133/74 | HR 57 | Temp 98.4°F | Resp 14 | Ht 64.0 in | Wt 144.0 lb

## 2018-06-11 DIAGNOSIS — K921 Melena: Secondary | ICD-10-CM | POA: Diagnosis present

## 2018-06-11 DIAGNOSIS — D122 Benign neoplasm of ascending colon: Secondary | ICD-10-CM

## 2018-06-11 DIAGNOSIS — K625 Hemorrhage of anus and rectum: Secondary | ICD-10-CM

## 2018-06-11 MED ORDER — SODIUM CHLORIDE 0.9 % IV SOLN: 500.0000 mL | Freq: Once | INTRAVENOUS | Status: DC

## 2018-06-11 NOTE — Patient Instructions (Signed)
YOU HAD AN ENDOSCOPIC PROCEDURE TODAY AT THE [facility]:   Refer to the procedure report that was given to you for any specific questions about what was found during the examination.  If the procedure report does not answer your questions, please call your gastroenterologist to clarify.  If you requested that your care partner not be given the details of your procedure findings, then the procedure report has been included in a sealed envelope for you to review at your convenience later.  YOU SHOULD EXPECT: Some feelings of bloating in the abdomen. Passage of more gas than usual.  Walking can help get rid of the air that was put into your GI tract during the procedure and reduce the bloating. If you had a lower endoscopy (such as a colonoscopy or flexible sigmoidoscopy) you may notice spotting of blood in your stool or on the toilet paper. If you underwent a bowel prep for your procedure, you may not have a normal bowel movement for a few days.  Please Note:  You might notice some irritation and congestion in your nose or some drainage.  This is from the oxygen used during your procedure.  There is no need for concern and it should clear up in a day or so.  SYMPTOMS TO REPORT IMMEDIATELY:   Following lower endoscopy (colonoscopy or flexible sigmoidoscopy):  Excessive amounts of blood in the stool  Significant tenderness or worsening of abdominal pains  Swelling of the abdomen that is new, acute  Fever of 100F or higher  For urgent or emergent issues, a gastroenterologist can be reached at any hour by calling [phone].   DIET:  We do recommend a small meal at first, but then you may proceed to your regular diet.  Drink plenty of fluids but you should avoid alcoholic beverages for 24 hours.  ACTIVITY:  You should plan to take it easy for the rest of today and you should NOT DRIVE or use heavy machinery until tomorrow (because of the sedation medicines used during the test).     FOLLOW UP: Our staff will call the number listed on your records the next business day following your procedure to check on you and address any questions or concerns that you may have regarding the information given to you following your procedure. If we do not reach you, we will leave a message.  However, if you are feeling well and you are not experiencing any problems, there is no need to return our call.  We will assume that you have returned to your regular daily activities without incident.  If any biopsies were taken you will be contacted by phone or by letter within the next 1-3 weeks.  Please call us at [phone] if you have not heard about the biopsies in 3 weeks.   Await for biopsy results Polyps (handout given) Hemorrhoids (handout given) Diverticulosis (handout given)  SIGNATURES/CONFIDENTIALITY: You and/or your care partner have signed paperwork which will be entered into your electronic medical record.  These signatures attest to the fact that that the information above on your After Visit Summary has been reviewed and is understood.  Full responsibility of the confidentiality of this discharge information lies with you and/or your care-partner. 

## 2018-06-11 NOTE — Op Note (Signed)
[facility] Patient Name: Alyssa Lamb Procedure Date: 06/11/2018 10:51 AM MRN: [id number] Endoscopist: [place]. Loletha Carrow , MD Age: 62 Referring MD:  Date of Birth: 1956-06-13 Gender: Female Account #: [id number] Procedure:                Colonoscopy Indications:              Rectal bleeding (brief, self-limited) Medicines:                Monitored Anesthesia Care Procedure:                Pre-Anesthesia Assessment:                           - Prior to the procedure, a History and Physical                            was performed, and patient medications and                            allergies were reviewed. The patient's tolerance of                            previous anesthesia was also reviewed. The risks                            and benefits of the procedure and the sedation                            options and risks were discussed with the patient.                            All questions were answered, and informed consent                            was obtained. Prior Anticoagulants: The patient has                            taken no previous anticoagulant or antiplatelet                            agents. ASA Grade Assessment: II - A patient with                            mild systemic disease. After reviewing the risks                            and benefits, the patient was deemed in                            satisfactory condition to undergo the procedure.                           After obtaining informed consent, the colonoscope  was passed under direct vision. Throughout the                            procedure, the patient's blood pressure, pulse, and                            oxygen saturations were monitored continuously. The                            Colonoscope was introduced through the anus and                            advanced to the the cecum, identified by                            appendiceal orifice and  ileocecal valve. The                            colonoscopy was performed without difficulty. The                            patient tolerated the procedure well. The quality                            of the bowel preparation was excellent. The                            ileocecal valve, appendiceal orifice, and rectum                            were photographed. The quality of the bowel                            preparation was evaluated using the BBPS [facility]                            Bowel Preparation Scale) with scores of: Right                            Colon = 3, Transverse Colon = 3 and Left Colon = 3                            (entire mucosa seen well with no residual staining,                            small fragments of stool or opaque liquid). The                            total BBPS score equals 9. The bowel preparation                            used was Plenvu. Scope In: 11:00:07 AM Scope Out: 16:10:96 AM Scope Withdrawal Time: 0 hours 11  minutes 5 seconds  Total Procedure Duration: 0 hours 16 minutes 7 seconds  Findings:                 The perianal and digital rectal examinations were                            normal except for small hypertrophied anal papillae.                           Two sessile polyps were found in the ascending                            colon. The polyps were 3 to 5 mm in size. These                            polyps were removed with a cold snare. Resection                            and retrieval were complete.                           Multiple diverticula were found in the left colon                            and right colon.                           Internal hemorrhoids were found. The hemorrhoids                            were small and Grade I (internal hemorrhoids that                            do not prolapse).                           Anal papilla(e) were hypertrophied.                           The exam was otherwise without  abnormality on                            direct and retroflexion views. Complications:            No immediate complications. Estimated Blood Loss:     Estimated blood loss: none. Impression:               - Two 3 to 5 mm polyps in the ascending colon,                            removed with a cold snare. Resected and retrieved.                           - Diverticulosis in the left colon and in the right  colon.                           - Internal hemorrhoids.                           - Anal papilla(e) were hypertrophied.                           - The examination was otherwise normal on direct                            and retroflexion views. Recommendation:           - Patient has a contact number available for                            emergencies. The signs and symptoms of potential                            delayed complications were discussed with the                            patient. Return to normal activities tomorrow.                            Written discharge instructions were provided to the                            patient.                           - Resume previous diet.                           - Continue present medications.                           - Await pathology results.                           - Repeat colonoscopy is recommended for                            surveillance. The colonoscopy date will be                            determined after pathology results from today's                            exam become available for review.                           - Preparation H suppository: Insert rectally as                            necessary for bleeding. Senaya Dicenso L. Loletha Carrow, MD 06/11/2018 11:22:28 AM This report has been  signed electronically.

## 2018-06-11 NOTE — Progress Notes (Signed)
Report given to PACU, vss 

## 2018-06-11 NOTE — Progress Notes (Signed)
Called to room to assist during endoscopic procedure.  Patient ID and intended procedure confirmed with present staff. Received instructions for my participation in the procedure from the performing physician.  

## 2018-06-12 ENCOUNTER — Telehealth: Payer: Self-pay | Admitting: *Deleted

## 2018-06-12 NOTE — Telephone Encounter (Signed)
No answer, second call.  Left message to call if questions or concerns. 

## 2018-06-12 NOTE — Telephone Encounter (Signed)
  Follow up Call-  Call back number 06/11/2018  Post procedure Call Back phone  # [phone]  Permission to leave phone message Yes  Some recent data might be hidden     Patient questions:  Message left to call us if necessary.

## 2018-06-22 ENCOUNTER — Encounter: Payer: Self-pay | Admitting: Gastroenterology

## 2018-06-26 ENCOUNTER — Ambulatory Visit (INDEPENDENT_AMBULATORY_CARE_PROVIDER_SITE_OTHER): Payer: Managed Care, Other (non HMO)

## 2018-06-26 DIAGNOSIS — Z299 Encounter for prophylactic measures, unspecified: Secondary | ICD-10-CM | POA: Diagnosis not present

## 2018-07-03 ENCOUNTER — Ambulatory Visit: Payer: Managed Care, Other (non HMO)

## 2019-05-13 ENCOUNTER — Encounter: Payer: Self-pay | Admitting: Internal Medicine

## 2019-06-03 ENCOUNTER — Ambulatory Visit (INDEPENDENT_AMBULATORY_CARE_PROVIDER_SITE_OTHER): Payer: Medicare Other | Admitting: Internal Medicine

## 2019-06-03 ENCOUNTER — Encounter: Payer: Self-pay | Admitting: Internal Medicine

## 2019-06-03 ENCOUNTER — Ambulatory Visit (INDEPENDENT_AMBULATORY_CARE_PROVIDER_SITE_OTHER)
Admission: RE | Admit: 2019-06-03 | Discharge: 2019-06-03 | Disposition: A | Discharge status: Home / Self Care | Payer: Medicare Other | Source: Ambulatory Visit | Attending: Internal Medicine | Admitting: Internal Medicine

## 2019-06-03 ENCOUNTER — Other Ambulatory Visit (INDEPENDENT_AMBULATORY_CARE_PROVIDER_SITE_OTHER): Payer: Medicare Other

## 2019-06-03 ENCOUNTER — Other Ambulatory Visit: Payer: Self-pay

## 2019-06-03 VITALS — BP 130/88 | HR 73 | Temp 97.8°F | Ht 64.0 in | Wt 146.0 lb

## 2019-06-03 DIAGNOSIS — Z0000 Encounter for general adult medical examination without abnormal findings: Secondary | ICD-10-CM

## 2019-06-03 DIAGNOSIS — M25551 Pain in right hip: Secondary | ICD-10-CM | POA: Insufficient documentation

## 2019-06-03 DIAGNOSIS — Z23 Encounter for immunization: Secondary | ICD-10-CM

## 2019-06-03 DIAGNOSIS — Q639 Congenital malformation of kidney, unspecified: Secondary | ICD-10-CM

## 2019-06-03 LAB — LIPID PANEL
Cholesterol: 215 mg/dL — ABNORMAL HIGH (ref 0–200)
HDL: 59.4 mg/dL (ref >39.00)
LDL Cholesterol: 144 mg/dL — ABNORMAL HIGH (ref 0–99)
NonHDL: 155.44
Total CHOL/HDL Ratio: 4
Triglycerides: 58 mg/dL (ref 0.0–149.0)
VLDL: 11.6 mg/dL (ref 0.0–40.0)

## 2019-06-03 LAB — COMPREHENSIVE METABOLIC PANEL
ALT: 11 U/L (ref 0–35)
AST: 17 U/L (ref 0–37)
Albumin: 4.2 g/dL (ref 3.5–5.2)
Alkaline Phosphatase: 80 U/L (ref 39–117)
BUN: 17 mg/dL (ref 6–23)
CO2: 30 mEq/L (ref 19–32)
Calcium: 9.1 mg/dL (ref 8.4–10.5)
Chloride: 103 mEq/L (ref 96–112)
Creatinine, Ser: 0.96 mg/dL (ref 0.40–1.20)
GFR: 58.29 mL/min — ABNORMAL LOW (ref >60.00)
Glucose, Bld: 89 mg/dL (ref 70–99)
Potassium: 4 mEq/L (ref 3.5–5.1)
Sodium: 138 mEq/L (ref 135–145)
Total Bilirubin: 0.5 mg/dL (ref 0.2–1.2)
Total Protein: 6.9 g/dL (ref 6.0–8.3)

## 2019-06-03 LAB — CBC
HCT: 40.5 % (ref 36.0–46.0)
Hemoglobin: 13.6 g/dL (ref 12.0–15.0)
MCHC: 33.5 g/dL (ref 30.0–36.0)
MCV: 88.5 fl (ref 78.0–100.0)
Platelets: 218 10*3/uL (ref 150.0–400.0)
RBC: 4.57 Mil/uL (ref 3.87–5.11)
RDW: 13.9 % (ref 11.5–15.5)
WBC: 4.3 10*3/uL (ref 4.0–10.5)

## 2019-06-03 NOTE — Assessment & Plan Note (Signed)
Flu shot counseled to get yearly. Pneumonia 13 given today and 23 will be given in 1 year. Shingrix completed. Tetanus given and due again in 2030. Colonoscopy due in 2024. Mammogram due in 2021, need to get records from last in 2019, pap smear up to date and need to get records from gyn and will not be due again and dexa declines today. Counseled about sun safety and mole surveillance. Counseled about the dangers of distracted driving. Given 10 year screening recommendations.

## 2019-06-03 NOTE — Assessment & Plan Note (Signed)
Checking x-ray to rule out or assess severity of arthritis. She knows to avoid NSAIDs with her congenital kidney anomaly.

## 2019-06-03 NOTE — Progress Notes (Signed)
Subjective:   Patient ID: Alyssa Lamb, female    DOB: 04-Jul-1956, 63 y.o.   MRN: [id number]  HPI Here for welcome to medicare wellness and physical, no new complaints. Please see A/P for status and treatment of chronic medical problems.   Diet: heart healthy Physical activity: sedentary, walking daily for last 2-3 weeks Depression/mood screen: negative Hearing: intact to whispered voice, mild tinnitus stable bilateral Visual acuity: grossly normal with lens, performs annual eye exam, see below for screening ADLs: capable Fall risk: none Home safety: good Cognitive evaluation: intact to orientation, naming, recall and repetition EOL planning: adv directives discussed, in place    Office Visit from 03/09/2018 in [facility]  PHQ-2 Total Score  0         Hearing Screening   125Hz  250Hz  500Hz  1000Hz  2000Hz  3000Hz  4000Hz  6000Hz  8000Hz   Right ear:           Left ear:             Visual Acuity Screening   Right eye Left eye Both eyes  Without correction:     With correction: 20/20 20/20 20/20    I have personally reviewed and have noted 1. The patient's medical and social history - reviewed today no changes 2. Their use of alcohol, tobacco or illicit drugs 3. Their current medications and supplements 4. The patient's functional ability including ADL's, fall risks, home safety risks and hearing or visual impairment. 5. Diet and physical activities 6. Evidence for depression or mood disorders 7. Care team reviewed and updated  Patient Care Team: Hoyt Koch, MD as PCP - General (Internal Medicine) Past Medical History:  Diagnosis Date  . Arthritis   . Hematuria   . History of kidney stones   . 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 Nephrolithiasis    bilateral  . Urgency of urination   . Wears glasses    Past Surgical History:  Procedure Laterality Date  . CYSTOSCOPY WITH RETROGRADE  PYELOGRAM, URETEROSCOPY AND STENT PLACEMENT Left 09/22/2015   Procedure: CYSTOSCOPY WITH LEFT RETROGRADE PYELOGRAM, URETEROSCOPY, STONE EXTRACTION AND STENT PLACEMENT;  Surgeon: Franchot Gallo, MD;  Location: [facility];  Service: Urology;  Laterality: Left;  . HOLMIUM LASER APPLICATION Left 0/53/9767   Procedure: HOLMIUM LASER APPLICATION;  Surgeon: Franchot Gallo, MD;  Location: [facility];  Service: Urology;  Laterality: Left;  . NEPHROLITHOTOMY  1975  . ORIF RIGHT ANKLE FX  1967  . RIGHT SHOULDER SURGERY  2013   repair nerve  . TUBAL LIGATION  1986   Family History  Problem Relation Age of Onset  . Arthritis Mother   . Hyperlipidemia Mother   . Heart disease Mother   . Hypertension Mother   . Diabetes Mother   . Colon cancer Neg Hx   . Esophageal cancer Neg Hx   . Stomach cancer Neg Hx    Review of Systems  Constitutional: Negative.   HENT: Negative.   Eyes: Negative.   Respiratory: Negative for cough, chest tightness and shortness of breath.   Cardiovascular: Negative for chest pain, palpitations and leg swelling.  Gastrointestinal: Negative for abdominal distention, abdominal pain, constipation, diarrhea, nausea and vomiting.  Musculoskeletal: Positive for arthralgias.  Skin: Negative.   Neurological: Negative.   Psychiatric/Behavioral: Negative.     Objective:  Physical Exam Constitutional:      Appearance: She is well-developed.  HENT:  Head: Normocephalic and atraumatic.  Neck:     Musculoskeletal: Normal range of motion.  Cardiovascular:     Rate and Rhythm: Normal rate and regular rhythm.  Pulmonary:     Effort: Pulmonary effort is normal. No respiratory distress.     Breath sounds: Normal breath sounds. No wheezing or rales.  Abdominal:     General: Bowel sounds are normal. There is no distension.     Palpations: Abdomen is soft.     Tenderness: There is no abdominal tenderness. There is no rebound.  Skin:     General: Skin is warm and dry.  Neurological:     Mental Status: She is alert and oriented to person, place, and time.     Coordination: Coordination normal.     Vitals:   06/03/19 1007  BP: 130/88  Pulse: 73  Temp: 97.8 F (36.6 C)  TempSrc: Oral  SpO2: 97%  Weight: 146 lb (66.2 kg)  Height: 5\' 4"  (1.626 m)    Assessment & Plan:  Tdap and Prevnar 13 given at visit

## 2019-06-03 NOTE — Patient Instructions (Signed)
We will get the x-ray today and in with the dermatologist to get the mole off.   Health Maintenance, Female Adopting a healthy lifestyle and getting preventive care can go a long way to promote health and wellness. Talk with your health care provider about what schedule of regular examinations is right for you. This is a good chance for you to check in with your provider about disease prevention and staying healthy. In between checkups, there are plenty of things you can do on your own. Experts have done a lot of research about which lifestyle changes and preventive measures are most likely to keep you healthy. Ask your health care provider for more information. Weight and diet Eat a healthy diet  Be sure to include plenty of vegetables, fruits, low-fat dairy products, and lean protein.  Do not eat a lot of foods high in solid fats, added sugars, or salt.  Get regular exercise. This is one of the most important things you can do for your health. ? Most adults should exercise for at least 150 minutes each week. The exercise should increase your heart rate and make you sweat (moderate-intensity exercise). ? Most adults should also do strengthening exercises at least twice a week. This is in addition to the moderate-intensity exercise. Maintain a healthy weight  Body mass index (BMI) is a measurement that can be used to identify possible weight problems. It estimates body fat based on height and weight. Your health care provider can help determine your BMI and help you achieve or maintain a healthy weight.  For females 34 years of age and older: ? A BMI below 18.5 is considered underweight. ? A BMI of 18.5 to 24.9 is normal. ? A BMI of 25 to 29.9 is considered overweight. ? A BMI of 30 and above is considered obese. Watch levels of cholesterol and blood lipids  You should start having your blood tested for lipids and cholesterol at 64 years of age, then have this test every 5 years.  You may  need to have your cholesterol levels checked more often if: ? Your lipid or cholesterol levels are high. ? You are older than 64 years of age. ? You are at high risk for heart disease. Cancer screening Lung Cancer  Lung cancer screening is recommended for adults 79-28 years old who are at high risk for lung cancer because of a history of smoking.  A yearly low-dose CT scan of the lungs is recommended for people who: ? Currently smoke. ? Have quit within the past 15 years. ? Have at least a 30-pack-year history of smoking. A pack year is smoking an average of one pack of cigarettes a day for 1 year.  Yearly screening should continue until it has been 15 years since you quit.  Yearly screening should stop if you develop a health problem that would prevent you from having lung cancer treatment. Breast Cancer  Practice breast self-awareness. This means understanding how your breasts normally appear and feel.  It also means doing regular breast self-exams. Let your health care provider know about any changes, no matter how small.  If you are in your 20s or 30s, you should have a clinical breast exam (CBE) by a health care provider every 1-3 years as part of a regular health exam.  If you are 34 or older, have a CBE every year. Also consider having a breast X-ray (mammogram) every year.  If you have a family history of breast cancer, talk to  your health care provider about genetic screening.  If you are at high risk for breast cancer, talk to your health care provider about having an MRI and a mammogram every year.  Breast cancer gene (BRCA) assessment is recommended for women who have family members with BRCA-related cancers. BRCA-related cancers include: ? Breast. ? Ovarian. ? Tubal. ? Peritoneal cancers.  Results of the assessment will determine the need for genetic counseling and BRCA1 and BRCA2 testing. Cervical Cancer Your health care provider may recommend that you be screened  regularly for cancer of the pelvic organs (ovaries, uterus, and vagina). This screening involves a pelvic examination, including checking for microscopic changes to the surface of your cervix (Pap test). You may be encouraged to have this screening done every 3 years, beginning at age 21.  For women ages 30-65, health care providers may recommend pelvic exams and Pap testing every 3 years, or they may recommend the Pap and pelvic exam, combined with testing for human papilloma virus (HPV), every 5 years. Some types of HPV increase your risk of cervical cancer. Testing for HPV may also be done on women of any age with unclear Pap test results.  Other health care providers may not recommend any screening for nonpregnant women who are considered low risk for pelvic cancer and who do not have symptoms. Ask your health care provider if a screening pelvic exam is right for you.  If you have had past treatment for cervical cancer or a condition that could lead to cancer, you need Pap tests and screening for cancer for at least 20 years after your treatment. If Pap tests have been discontinued, your risk factors (such as having a new sexual partner) need to be reassessed to determine if screening should resume. Some women have medical problems that increase the chance of getting cervical cancer. In these cases, your health care provider may recommend more frequent screening and Pap tests. Colorectal Cancer  This type of cancer can be detected and often prevented.  Routine colorectal cancer screening usually begins at 65 years of age and continues through 65 years of age.  Your health care provider may recommend screening at an earlier age if you have risk factors for colon cancer.  Your health care provider may also recommend using home test kits to check for hidden blood in the stool.  A small camera at the end of a tube can be used to examine your colon directly (sigmoidoscopy or colonoscopy). This is  done to check for the earliest forms of colorectal cancer.  Routine screening usually begins at age 50.  Direct examination of the colon should be repeated every 5-10 years through 65 years of age. However, you may need to be screened more often if early forms of precancerous polyps or small growths are found. Skin Cancer  Check your skin from head to toe regularly.  Tell your health care provider about any new moles or changes in moles, especially if there is a change in a mole's shape or color.  Also tell your health care provider if you have a mole that is larger than the size of a pencil eraser.  Always use sunscreen. Apply sunscreen liberally and repeatedly throughout the day.  Protect yourself by wearing long sleeves, pants, a wide-brimmed hat, and sunglasses whenever you are outside. Heart disease, diabetes, and high blood pressure  High blood pressure causes heart disease and increases the risk of stroke. High blood pressure is more likely to develop in: ?   People who have blood pressure in the high end of the normal range (130-139/85-89 mm Hg). ? People who are overweight or obese. ? People who are African American.  If you are 73-57 years of age, have your blood pressure checked every 3-5 years. If you are 78 years of age or older, have your blood pressure checked every year. You should have your blood pressure measured twice-once when you are at a hospital or clinic, and once when you are not at a hospital or clinic. Record the average of the two measurements. To check your blood pressure when you are not at a hospital or clinic, you can use: ? An automated blood pressure machine at a pharmacy. ? A home blood pressure monitor.  If you are between 46 years and 62 years old, ask your health care provider if you should take aspirin to prevent strokes.  Have regular diabetes screenings. This involves taking a blood sample to check your fasting blood sugar level. ? If you are at a  normal weight and have a low risk for diabetes, have this test once every three years after 65 years of age. ? If you are overweight and have a high risk for diabetes, consider being tested at a younger age or more often. Preventing infection Hepatitis B  If you have a higher risk for hepatitis B, you should be screened for this virus. You are considered at high risk for hepatitis B if: ? You were born in a country where hepatitis B is common. Ask your health care provider which countries are considered high risk. ? Your parents were born in a high-risk country, and you have not been immunized against hepatitis B (hepatitis B vaccine). ? You have HIV or AIDS. ? You use needles to inject street drugs. ? You live with someone who has hepatitis B. ? You have had sex with someone who has hepatitis B. ? You get hemodialysis treatment. ? You take certain medicines for conditions, including cancer, organ transplantation, and autoimmune conditions. Hepatitis C  Blood testing is recommended for: ? Everyone born from [birth year] through 1967. ? Anyone with known risk factors for hepatitis C. Sexually transmitted infections (STIs)  You should be screened for sexually transmitted infections (STIs) including gonorrhea and chlamydia if: ? You are sexually active and are younger than 65 years of age. ? You are older than 65 years of age and your health care provider tells you that you are at risk for this type of infection. ? Your sexual activity has changed since you were last screened and you are at an increased risk for chlamydia or gonorrhea. Ask your health care provider if you are at risk.  If you do not have HIV, but are at risk, it may be recommended that you take a prescription medicine daily to prevent HIV infection. This is called pre-exposure prophylaxis (PrEP). You are considered at risk if: ? You are sexually active and do not regularly use condoms or know the HIV status of your partner(s). ? You  take drugs by injection. ? You are sexually active with a partner who has HIV. Talk with your health care provider about whether you are at high risk of being infected with HIV. If you choose to begin PrEP, you should first be tested for HIV. You should then be tested every 3 months for as long as you are taking PrEP. Pregnancy  If you are premenopausal and you may become pregnant, ask your health care provider about  preconception counseling.  If you may become pregnant, take 400 to 800 micrograms (mcg) of folic acid every day.  If you want to prevent pregnancy, talk to your health care provider about birth control (contraception). Osteoporosis and menopause  Osteoporosis is a disease in which the bones lose minerals and strength with aging. This can result in serious bone fractures. Your risk for osteoporosis can be identified using a bone density scan.  If you are 77 years of age or older, or if you are at risk for osteoporosis and fractures, ask your health care provider if you should be screened.  Ask your health care provider whether you should take a calcium or vitamin D supplement to lower your risk for osteoporosis.  Menopause may have certain physical symptoms and risks.  Hormone replacement therapy may reduce some of these symptoms and risks. Talk to your health care provider about whether hormone replacement therapy is right for you. Follow these instructions at home:  Schedule regular health, dental, and eye exams.  Stay current with your immunizations.  Do not use any tobacco products including cigarettes, chewing tobacco, or electronic cigarettes.  If you are pregnant, do not drink alcohol.  If you are breastfeeding, limit how much and how often you drink alcohol.  Limit alcohol intake to no more than 1 drink per day for nonpregnant women. One drink equals 12 ounces of beer, 5 ounces of wine, or 1 ounces of hard liquor.  Do not use street drugs.  Do not share  needles.  Ask your health care provider for help if you need support or information about quitting drugs.  Tell your health care provider if you often feel depressed.  Tell your health care provider if you have ever been abused or do not feel safe at home. This information is not intended to replace advice given to you by your health care provider. Make sure you discuss any questions you have with your health care provider. Document Released: 06/24/2011 Document Revised: 05/16/2016 Document Reviewed: 09/12/2015 Elsevier Interactive Patient Education  2019 [employer].

## 2019-06-03 NOTE — Assessment & Plan Note (Signed)
Checking CMP and adjust as needed. No diabetes or hypertension. She will go back to monitoring BP 1-2 times per month. We talked about likelihood that BP may increase with time.

## 2019-06-05 ENCOUNTER — Encounter: Payer: Self-pay | Admitting: Internal Medicine

## 2019-06-07 ENCOUNTER — Encounter: Payer: Self-pay | Admitting: Internal Medicine

## 2019-06-24 ENCOUNTER — Encounter: Payer: Self-pay | Admitting: Internal Medicine

## 2019-06-28 ENCOUNTER — Encounter: Payer: Self-pay | Admitting: Internal Medicine

## 2019-08-18 ENCOUNTER — Encounter: Payer: Self-pay | Admitting: Internal Medicine

## 2019-09-24 ENCOUNTER — Encounter: Payer: Self-pay | Admitting: Internal Medicine

## 2019-09-24 DIAGNOSIS — G8929 Other chronic pain: Secondary | ICD-10-CM

## 2019-09-24 DIAGNOSIS — R251 Tremor, unspecified: Secondary | ICD-10-CM

## 2019-10-01 ENCOUNTER — Encounter: Payer: Self-pay | Admitting: Neurology

## 2019-11-02 NOTE — Progress Notes (Signed)
Alyssa Lamb was seen today in the movement disorders clinic for neurologic consultation at the request of Hoyt Koch, *.  The consultation is for the evaluation of Parkinsons disease.  The records that were made available to me were reviewed.  There is nothing in medical records regarding Parkinsons and/or tremor that I could find.  Pt last seen by PCP in June, 2020.  Pt states that her old PCP told her many years ago that she had tremor c/w PD but she never saw a neurologist.  Tremor: Yes.     How long has it been going on? 30 years  At rest or with activation?  Activation - esp if doing fine motor coordination  Fam hx of tremor?  Yes.  , mother and brother both with PD.  Mom dx at 99 and brother is younger than pt  Located where?  Bilateral hands and started in both hands.    Affected by caffeine:  No. (2 cups tea per day)  Affected by alcohol:  Unknown (drinks about 2 glasses wine/week)  Affected by stress:  Yes.    Affected by fatigue:  Yes.    Spills soup if on spoon:  No.  Spills glass of liquid if full:  No.  Affects ADL's (tying shoes, brushing teeth, etc):  No.  Tremor inducing meds:  No.  Other Specific Symptoms:  Voice: weaker Sleep: gets up to use the RR but falls quickly back asleep  Vivid Dreams:  Yes.    Acting out dreams:  No. Wet Pillows: Yes.   Postural symptoms:  No.  Falls?  No. Bradykinesia symptoms: slow movements (a little slower in her mile walk) Loss of smell:  Yes.  , a little Loss of taste:  No. Urinary Incontinence:  No. (little stress incontinence) Difficulty Swallowing:  No. Handwriting, micrographia: No., got bigger Depression:  No. Memory changes:  No. Hallucinations:  No.  visual distortions: No. N/V:  No. Lightheaded:  No.  Syncope: No. Diplopia:  No. Dyskinesia:  No.  Neuroimaging of the brain has not previously been performed.  PREVIOUS MEDICATIONS: none to date  ALLERGIES:   Allergies  Allergen Reactions  . Other  Rash    Wool causes rash  . Penicillins Hives    CURRENT MEDICATIONS:  Current Outpatient Medications  Medication Instructions  . Acetaminophen (TYLENOL) 325 MG CAPS Daily PRN  . diphenhydrAMINE-APAP, sleep, 25-500 MG CAPS Oral, Daily at bedtime    PAST MEDICAL HISTORY:   Past Medical History:  Diagnosis Date  . Arthritis   . Hematuria   . History of kidney stones   . 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 Nephrolithiasis    bilateral  . Urgency of urination   . Wears glasses     PAST SURGICAL HISTORY:   Past Surgical History:  Procedure Laterality Date  . CYSTOSCOPY WITH RETROGRADE PYELOGRAM, URETEROSCOPY AND STENT PLACEMENT Left 09/22/2015   Procedure: CYSTOSCOPY WITH LEFT RETROGRADE PYELOGRAM, URETEROSCOPY, STONE EXTRACTION AND STENT PLACEMENT;  Surgeon: Franchot Gallo, MD;  Location[facility];  Service: Urology;  Laterality: Left;  . HOLMIUM LASER APPLICATION Left [id number]   Procedure: HOLMIUM LASER APPLICATION;  Surgeon: Franchot Gallo, MD;  Location: [facility];  Service: Urology;  Laterality: Left;  . NEPHROLITHOTOMY  1975  . ORIF RIGHT ANKLE FX  1967  . RIGHT SHOULDER SURGERY  2013   repair nerve  . ROTATOR CUFF  REPAIR Left   . TUBAL LIGATION  1986    SOCIAL HISTORY:   Social History   Socioeconomic History  . Marital status: Married    Spouse name: Not on file  . Number of children: Not on file  . Years of education: Not on file  . Highest education level: Not on file  Occupational History  . Not on file  Social Needs  . Financial resource strain: Not on file  . Food insecurity    Worry: Not on file    Inability: Not on file  . Transportation needs    Medical: Not on file    Non-medical: Not on file  Tobacco Use  . Smoking status: Former Smoker    Years: 8.00    Types: Cigarettes    Quit date: 09/18/1977    Years since quitting: 42.1  . Smokeless tobacco: Never Used   Substance and Sexual Activity  . Alcohol use: Yes    Alcohol/week: 2.0 standard drinks    Types: 2 Glasses of wine per week  . Drug use: No  . Sexual activity: Not on file  Lifestyle  . Physical activity    Days per week: Not on file    Minutes per session: Not on file  . Stress: Not on file  Relationships  . Social Herbalist on phone: Not on file    Gets together: Not on file    Attends religious service: Not on file    Active member of club or organization: Not on file    Attends meetings of clubs or organizations: Not on file    Relationship status: Not on file  . Intimate partner violence    Fear of current or ex partner: Not on file    Emotionally abused: Not on file    Physically abused: Not on file    Forced sexual activity: Not on file  Other Topics Concern  . Not on file  Social History Narrative  . Not on file    FAMILY HISTORY:   Family Status  Relation Name Status  . Mother  Alive  . Father unknown Other  . Sister  Alive  . Brother 2 Alive  . Neg Hx  (Not Specified)    ROS:  Review of Systems  Constitutional: Negative.   HENT: Negative.   Eyes: Negative.   Respiratory: Negative.   Cardiovascular: Negative.   Gastrointestinal: Positive for constipation.  Genitourinary: Positive for frequency.  Musculoskeletal: Positive for back pain (lower, has appt with Dr. Christella Noa).  Skin: Negative.   Endo/Heme/Allergies: Negative.     PHYSICAL EXAMINATION:    VITALS:   Vitals:   11/04/19 1322  BP: (!) 158/94  Pulse: 61  Resp: 16  SpO2: 100%  Weight: 144 lb (65.3 kg)  Height: 5\' 4"  (1.626 m)    GEN:  The patient appears stated age and is in NAD. HEENT:  Normocephalic, atraumatic.  The mucous membranes are moist. The superficial temporal arteries are without ropiness or tenderness. CV:  RRR Lungs:  CTAB Neck/HEME:  There are no carotid bruits bilaterally.  Neurological examination:  Orientation: The patient is alert and oriented x3.  Fund of knowledge is appropriate.  Recent and remote memory are intact.  Attention and concentration are normal.    Able to name objects and repeat phrases. Cranial nerves: There is good facial symmetry.  The visual fields are full to confrontational testing. The speech is fluent and clear. Soft palate rises symmetrically and  there is no tongue deviation. Hearing is intact to conversational tone. Sensation: Sensation is intact to light and pinprick throughout (facial, trunk, extremities). Vibration is intact at the bilateral big toe. There is no extinction with double simultaneous stimulation. There is no sensory dermatomal level identified. Motor: Strength is 5/5 in the bilateral upper and lower extremities.   Shoulder shrug is equal and symmetric.  There is no pronator drift. Deep tendon reflexes: Deep tendon reflexes are 2/4 at the bilateral biceps, triceps, brachioradialis, patella and achilles. Plantar responses are downgoing bilaterally.  Movement examination: Tone: There is normal tone in the UE/LE Abnormal movements: there is RUE rest tremor with distraction.  There is minimal postural tremor and mild intention tremor bilaterally.  she has no difficulty with archimedes spirals.  she has no difficulty when asked to pour a full glass of water from one glass to another.  Coordination:  There is no decremation with RAM's, with any form of RAMS, including alternating supination and pronation of the forearm, hand opening and closing, finger taps, heel taps and toe taps. Gait and Station: The patient has no difficulty arising out of a deep-seated chair without the use of the hands. The patient's stride length is good.  No trouble with tandem gait.  She has good arm swing.   Chemistry      Component Value Date/Time   NA 138 06/03/2019 1058   K 4.0 06/03/2019 1058   CL 103 06/03/2019 1058   CO2 30 06/03/2019 1058   BUN 17 06/03/2019 1058   CREATININE 0.96 06/03/2019 1058      Component Value  Date/Time   CALCIUM 9.1 06/03/2019 1058   ALKPHOS 80 06/03/2019 1058   AST 17 06/03/2019 1058   ALT 11 06/03/2019 1058   BILITOT 0.5 06/03/2019 1058     No results found for: TSH   ASSESSMENT/PLAN:  1.  Tremor.  -While the patient certainly does have a rest component to the right hand tremor, she also has an intention component.  She has also had tremor for over 30 years.  She does not meet [place] brain bank criteria for the diagnosis of Parkinson's disease, nor does she meet the more updated MDS criteria.  That being said, she has a fairly strong family history of Parkinson's disease in her brother and mother, so one has to wonder if there are not some genetics in play here.  Nonetheless, she still does not meet criteria.  We discussed a DaTscan, although I do not think that adds much clinically and she agrees.  Tremor is actually not that bothersome for her.  Ultimately, we decided to go ahead and check her TSH since that has not been done.  She agreed to allow me to follow her on a yearly basis, sooner should new neurologic issues arise.  We discussed the importance of safe, cardiovascular exercise.  I will see her back in follow-up in 1 year.  We will call her with the results of lab work.  Much greater than 50% of this visit was spent in counseling and coordinating care.  Total face to face time:  45 min  Cc:  Hoyt Koch, MD

## 2019-11-04 ENCOUNTER — Other Ambulatory Visit: Payer: Self-pay

## 2019-11-04 ENCOUNTER — Encounter: Payer: Self-pay | Admitting: Neurology

## 2019-11-04 ENCOUNTER — Ambulatory Visit: Payer: Medicare Other | Admitting: Neurology

## 2019-11-04 ENCOUNTER — Other Ambulatory Visit: Payer: Medicare Other

## 2019-11-04 VITALS — BP 158/94 | HR 61 | Resp 16 | Ht 64.0 in | Wt 144.0 lb

## 2019-11-04 DIAGNOSIS — R251 Tremor, unspecified: Secondary | ICD-10-CM | POA: Diagnosis not present

## 2019-11-04 DIAGNOSIS — Z820 Family history of epilepsy and other diseases of the nervous system: Secondary | ICD-10-CM | POA: Diagnosis not present

## 2019-11-04 LAB — TSH: TSH: 1.49 mIU/L (ref 0.40–4.50)

## 2019-11-04 NOTE — Patient Instructions (Signed)
The physicians and staff at [facility] are committed to providing excellent care. You may receive a survey requesting feedback about your experience at our office. We strive to receive "very good" responses to the survey questions. If you feel that your experience would prevent you from giving the office a "very good " response, please contact our office to try to remedy the situation. We may be reached at [phone]. Thank you for taking the time out of your busy day to complete the survey.  

## 2019-11-10 ENCOUNTER — Other Ambulatory Visit: Payer: Self-pay

## 2019-11-10 DIAGNOSIS — Z20822 Contact with and (suspected) exposure to covid-19: Secondary | ICD-10-CM

## 2019-11-12 LAB — NOVEL CORONAVIRUS, NAA: SARS-CoV-2, NAA: NOT DETECTED

## 2020-03-31 ENCOUNTER — Other Ambulatory Visit: Payer: Self-pay | Admitting: Internal Medicine

## 2020-03-31 DIAGNOSIS — Z1231 Encounter for screening mammogram for malignant neoplasm of breast: Secondary | ICD-10-CM

## 2020-04-03 ENCOUNTER — Other Ambulatory Visit: Payer: Self-pay

## 2020-04-03 ENCOUNTER — Ambulatory Visit
Admission: RE | Admit: 2020-04-03 | Discharge: 2020-04-03 | Disposition: A | Discharge status: Home / Self Care | Payer: Medicare Other | Source: Ambulatory Visit

## 2020-04-03 DIAGNOSIS — Z1231 Encounter for screening mammogram for malignant neoplasm of breast: Secondary | ICD-10-CM

## 2020-04-11 ENCOUNTER — Encounter: Payer: Self-pay | Admitting: Internal Medicine

## 2020-04-13 ENCOUNTER — Encounter: Payer: Self-pay | Admitting: Internal Medicine

## 2020-04-13 NOTE — Telephone Encounter (Signed)
Pt requesting new referral for dermatology to recheck skin moles.   You last saw pt in June. Do you want her to schedule another appointment.

## 2020-06-01 ENCOUNTER — Encounter: Payer: Self-pay | Admitting: Internal Medicine

## 2020-11-02 ENCOUNTER — Ambulatory Visit: Payer: Medicare Other | Admitting: Neurology

## 2020-11-03 ENCOUNTER — Ambulatory Visit: Payer: Medicare Other | Admitting: Neurology

## 2021-02-21 ENCOUNTER — Other Ambulatory Visit: Payer: Self-pay | Admitting: Internal Medicine

## 2021-02-21 DIAGNOSIS — Z1231 Encounter for screening mammogram for malignant neoplasm of breast: Secondary | ICD-10-CM

## 2021-04-16 ENCOUNTER — Ambulatory Visit: Payer: Medicare Other

## 2021-04-20 DIAGNOSIS — H10013 Acute follicular conjunctivitis, bilateral: Secondary | ICD-10-CM | POA: Diagnosis not present

## 2021-05-03 ENCOUNTER — Encounter: Payer: Self-pay | Admitting: Internal Medicine

## 2021-05-03 NOTE — Telephone Encounter (Signed)
Patient calling, would like to schedule. No orders in at this time

## 2021-05-04 ENCOUNTER — Other Ambulatory Visit: Payer: Self-pay

## 2021-05-04 DIAGNOSIS — E2839 Other primary ovarian failure: Secondary | ICD-10-CM

## 2021-05-08 ENCOUNTER — Ambulatory Visit (INDEPENDENT_AMBULATORY_CARE_PROVIDER_SITE_OTHER)
Admission: RE | Admit: 2021-05-08 | Discharge: 2021-05-08 | Disposition: A | Discharge status: Home / Self Care | Payer: Medicare Other | Source: Ambulatory Visit | Attending: Internal Medicine | Admitting: Internal Medicine

## 2021-05-08 ENCOUNTER — Other Ambulatory Visit: Payer: Self-pay

## 2021-05-08 DIAGNOSIS — E2839 Other primary ovarian failure: Secondary | ICD-10-CM | POA: Diagnosis not present

## 2021-05-10 ENCOUNTER — Encounter: Payer: Self-pay | Admitting: Internal Medicine

## 2021-05-10 ENCOUNTER — Ambulatory Visit: Payer: Medicare Other

## 2021-06-04 ENCOUNTER — Other Ambulatory Visit: Payer: Self-pay

## 2021-06-04 ENCOUNTER — Ambulatory Visit
Admission: RE | Admit: 2021-06-04 | Discharge: 2021-06-04 | Disposition: A | Discharge status: Home / Self Care | Payer: Medicare Other | Source: Ambulatory Visit | Attending: Internal Medicine | Admitting: Internal Medicine

## 2021-06-04 DIAGNOSIS — Z1231 Encounter for screening mammogram for malignant neoplasm of breast: Secondary | ICD-10-CM | POA: Diagnosis not present

## 2021-07-26 ENCOUNTER — Encounter: Payer: Self-pay | Admitting: Internal Medicine

## 2021-11-02 DIAGNOSIS — Q625 Duplication of ureter: Secondary | ICD-10-CM | POA: Diagnosis not present

## 2021-11-02 DIAGNOSIS — N200 Calculus of kidney: Secondary | ICD-10-CM | POA: Diagnosis not present

## 2021-11-22 NOTE — Telephone Encounter (Signed)
Error

## 2022-04-23 ENCOUNTER — Other Ambulatory Visit: Payer: Self-pay | Admitting: Internal Medicine

## 2022-04-23 ENCOUNTER — Encounter: Payer: Self-pay | Admitting: Internal Medicine

## 2022-04-23 DIAGNOSIS — Z1231 Encounter for screening mammogram for malignant neoplasm of breast: Secondary | ICD-10-CM

## 2022-04-25 ENCOUNTER — Encounter: Payer: Self-pay | Admitting: Internal Medicine

## 2022-05-16 DIAGNOSIS — B351 Tinea unguium: Secondary | ICD-10-CM | POA: Diagnosis not present

## 2022-05-22 ENCOUNTER — Encounter: Payer: Self-pay | Admitting: Internal Medicine

## 2022-05-22 ENCOUNTER — Ambulatory Visit (INDEPENDENT_AMBULATORY_CARE_PROVIDER_SITE_OTHER): Payer: Medicare Other | Admitting: Internal Medicine

## 2022-05-22 VITALS — BP 132/90 | HR 57 | Resp 18 | Ht 64.0 in | Wt 144.4 lb

## 2022-05-22 DIAGNOSIS — Z0001 Encounter for general adult medical examination with abnormal findings: Secondary | ICD-10-CM

## 2022-05-22 DIAGNOSIS — Q639 Congenital malformation of kidney, unspecified: Secondary | ICD-10-CM

## 2022-05-22 DIAGNOSIS — Z0000 Encounter for general adult medical examination without abnormal findings: Secondary | ICD-10-CM

## 2022-05-22 DIAGNOSIS — M25551 Pain in right hip: Secondary | ICD-10-CM

## 2022-05-22 DIAGNOSIS — R002 Palpitations: Secondary | ICD-10-CM | POA: Diagnosis not present

## 2022-05-22 LAB — LIPID PANEL
Cholesterol: 227 mg/dL — ABNORMAL HIGH (ref 0–200)
HDL: 68.9 mg/dL (ref >39.00)
LDL Cholesterol: 146 mg/dL — ABNORMAL HIGH (ref 0–99)
NonHDL: 157.65
Total CHOL/HDL Ratio: 3
Triglycerides: 56 mg/dL (ref 0.0–149.0)
VLDL: 11.2 mg/dL (ref 0.0–40.0)

## 2022-05-22 LAB — CBC
HCT: 41.4 % (ref 36.0–46.0)
Hemoglobin: 13.9 g/dL (ref 12.0–15.0)
MCHC: 33.6 g/dL (ref 30.0–36.0)
MCV: 89.6 fl (ref 78.0–100.0)
Platelets: 228 10*3/uL (ref 150.0–400.0)
RBC: 4.62 Mil/uL (ref 3.87–5.11)
RDW: 14.8 % (ref 11.5–15.5)
WBC: 4.3 10*3/uL (ref 4.0–10.5)

## 2022-05-22 LAB — VITAMIN D 25 HYDROXY (VIT D DEFICIENCY, FRACTURES): VITD: 30.7 ng/mL (ref 30.00–100.00)

## 2022-05-22 LAB — COMPREHENSIVE METABOLIC PANEL
ALT: 13 U/L (ref 0–35)
AST: 20 U/L (ref 0–37)
Albumin: 4.5 g/dL (ref 3.5–5.2)
Alkaline Phosphatase: 79 U/L (ref 39–117)
BUN: 21 mg/dL (ref 6–23)
CO2: 29 mEq/L (ref 19–32)
Calcium: 9.7 mg/dL (ref 8.4–10.5)
Chloride: 102 mEq/L (ref 96–112)
Creatinine, Ser: 0.85 mg/dL (ref 0.40–1.20)
GFR: 70.52 mL/min (ref >60.00)
Glucose, Bld: 93 mg/dL (ref 70–99)
Potassium: 3.9 mEq/L (ref 3.5–5.1)
Sodium: 138 mEq/L (ref 135–145)
Total Bilirubin: 0.6 mg/dL (ref 0.2–1.2)
Total Protein: 7.5 g/dL (ref 6.0–8.3)

## 2022-05-22 LAB — VITAMIN B12: Vitamin B-12: 258 pg/mL (ref 211–911)

## 2022-05-22 LAB — TSH: TSH: 1.5 u[IU]/mL (ref 0.35–5.50)

## 2022-05-22 NOTE — Progress Notes (Signed)
   Subjective:   Patient ID: Alyssa Lamb, female    DOB: 25-Nov-1956, 66 y.o.   MRN: [id number]  HPI Here for medicare wellness, with new complaints. Please see A/P for status and treatment of chronic medical problems.   Diet: heart healthy Physical activity: active Depression/mood screen: negative Hearing: intact to whispered voice, tinnitus stable Visual acuity: grossly normal with lens, performs annual eye exam  ADLs: capable Fall risk: none Home safety: good Cognitive evaluation: intact to orientation, naming, recall and repetition EOL planning: adv directives discussed  [place] Visit from 05/22/2022 in [place] at [facility]  PHQ-2 Total Score [place] Office Visit from 05/22/2022 in [place] at [facility]  PHQ-9 Total Score 0         11/04/2019    1:23 PM 05/22/2022    9:29 AM  [place] in the past year? 0 0  Was there an injury with Fall? 0 0  Fall Risk Category Calculator 0 0  Fall Risk Category Low Low    I have personally reviewed and have noted 1. The patient's medical and social history - reviewed today no changes 2. Their use of alcohol, tobacco or illicit drugs 3. Their current medications and supplements 4. The patient's functional ability including ADL's, fall risks, home safety risks and hearing or visual impairment. 5. Diet and physical activities 6. Evidence for depression or mood disorders 7. Care team reviewed and updated 8.  The patient is not on an opioid pain medication.  Patient Care Team: Hoyt Koch, MD as PCP - General (Internal Medicine) Past Medical History:  Diagnosis Date  . Arthritis   . Hematuria   . History of kidney stones   . 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 Nephrolithiasis    bilateral  . Urgency of urination   . Wears glasses    Past Surgical History:  Procedure Laterality Date  . CYSTOSCOPY WITH  RETROGRADE PYELOGRAM, URETEROSCOPY AND STENT PLACEMENT Left 09/22/2015   Procedure: CYSTOSCOPY WITH LEFT RETROGRADE PYELOGRAM, URETEROSCOPY, STONE EXTRACTION AND STENT PLACEMENT;  Surgeon: Franchot Gallo, MD;  Location: [facility];  Service: Urology;  Laterality: Left;  . HOLMIUM LASER APPLICATION Left 2/87/6811   Procedure: HOLMIUM LASER APPLICATION;  Surgeon: Franchot Gallo, MD;  Location: [facility];  Service: Urology;  Laterality: Left;  . NEPHROLITHOTOMY  1975  . ORIF RIGHT ANKLE FX  1967  . RIGHT SHOULDER SURGERY  2013   repair nerve  . ROTATOR CUFF REPAIR Left   . TUBAL LIGATION  1986   Family History  Problem Relation Age of Onset  . Arthritis Mother   . Hyperlipidemia Mother   . Heart disease Mother   . Hypertension Mother   . Diabetes Mother   . Colon cancer Neg Hx   . Esophageal cancer Neg Hx   . Stomach cancer Neg Hx    Review of Systems  Objective:  Physical Exam EKG: Rate 54, axis normal, interval normal, sinus brady, no st or t wave changes, no significant change compared to prior 2018   Vitals:   05/22/22 0929  BP: 132/90  Pulse: (!) 57  Resp: 18  SpO2: 99%  Weight: 144 lb 6.4 oz (65.5 kg)  Height: '5\' 4"'$  (1.626 m)   Assessment & Plan:

## 2022-05-23 ENCOUNTER — Other Ambulatory Visit: Payer: Self-pay | Admitting: Internal Medicine

## 2022-05-23 ENCOUNTER — Ambulatory Visit (INDEPENDENT_AMBULATORY_CARE_PROVIDER_SITE_OTHER): Payer: Medicare Other

## 2022-05-23 DIAGNOSIS — R002 Palpitations: Secondary | ICD-10-CM

## 2022-05-23 NOTE — Progress Notes (Unsigned)
Enrolled patient for a 14 day Zio XT  monitor to be mailed to patients home  °

## 2022-05-23 NOTE — Assessment & Plan Note (Signed)
Checking CMP and lipid panel and CBC. Seeing urology and we talked about considering nephrology if urology is not specialized in supernumerary kidneys.

## 2022-05-23 NOTE — Assessment & Plan Note (Signed)
Has seen orthopedic and is arthritis related. She uses otc tylenol successfully for pain and will continue.

## 2022-05-23 NOTE — Assessment & Plan Note (Signed)
EKG done which is normal. We discussed her episodes of palpitations/fast HR 1-2 per month could be PVC/PAC or SVT or A fib. EKG without findings today. We will check CBC, CMP, TSH, vitamin D and B12 to rule out metabolic cause. If normal we will get holter monitor for 2 weeks. If not captured will wait and see if this gets more or less often. If persistent can refer to cardiology.

## 2022-05-23 NOTE — Assessment & Plan Note (Signed)
Flu shot yearly. covid-19 counseled. Pneumonia due she thinks she has gotten at pharmacy. Shingrix complete. Tetanus due 2030. Colonoscopy due 2024. Mammogram due 2023 scheduled, pap smear aged out and dexa complete. Counseled about sun safety and mole surveillance. Counseled about the dangers of distracted driving. Given 10 year screening recommendations.

## 2022-05-25 DIAGNOSIS — R002 Palpitations: Secondary | ICD-10-CM

## 2022-05-27 ENCOUNTER — Encounter: Payer: Self-pay | Admitting: Internal Medicine

## 2022-06-05 ENCOUNTER — Ambulatory Visit
Admission: RE | Admit: 2022-06-05 | Discharge: 2022-06-05 | Disposition: A | Discharge status: Home / Self Care | Payer: Medicare Other | Source: Ambulatory Visit | Attending: Internal Medicine | Admitting: Internal Medicine

## 2022-06-05 DIAGNOSIS — Z1231 Encounter for screening mammogram for malignant neoplasm of breast: Secondary | ICD-10-CM

## 2022-06-07 ENCOUNTER — Ambulatory Visit
Admission: RE | Admit: 2022-06-07 | Discharge: 2022-06-07 | Disposition: A | Discharge status: Home / Self Care | Payer: Medicare Other | Source: Ambulatory Visit | Attending: Internal Medicine | Admitting: Internal Medicine

## 2022-06-07 DIAGNOSIS — Z1231 Encounter for screening mammogram for malignant neoplasm of breast: Secondary | ICD-10-CM | POA: Diagnosis not present

## 2022-06-10 ENCOUNTER — Ambulatory Visit: Payer: Medicare Other

## 2022-06-14 DIAGNOSIS — R002 Palpitations: Secondary | ICD-10-CM | POA: Diagnosis not present

## 2022-08-06 IMAGING — MG MM DIGITAL SCREENING BILAT W/ TOMO AND CAD
8 series · 9 of 24 positions shown · non-contrast
Comparison: Previous exam(s).

CLINICAL DATA: Screening.

EXAM:
DIGITAL SCREENING BILATERAL MAMMOGRAM WITH TOMOSYNTHESIS AND CAD
TECHNIQUE: Bilateral screening digital craniocaudal and mediolateral oblique
mammograms were obtained. Bilateral screening digital breast
tomosynthesis was performed. The images were evaluated with
computer-aided detection.

[R MLO synth-2D]
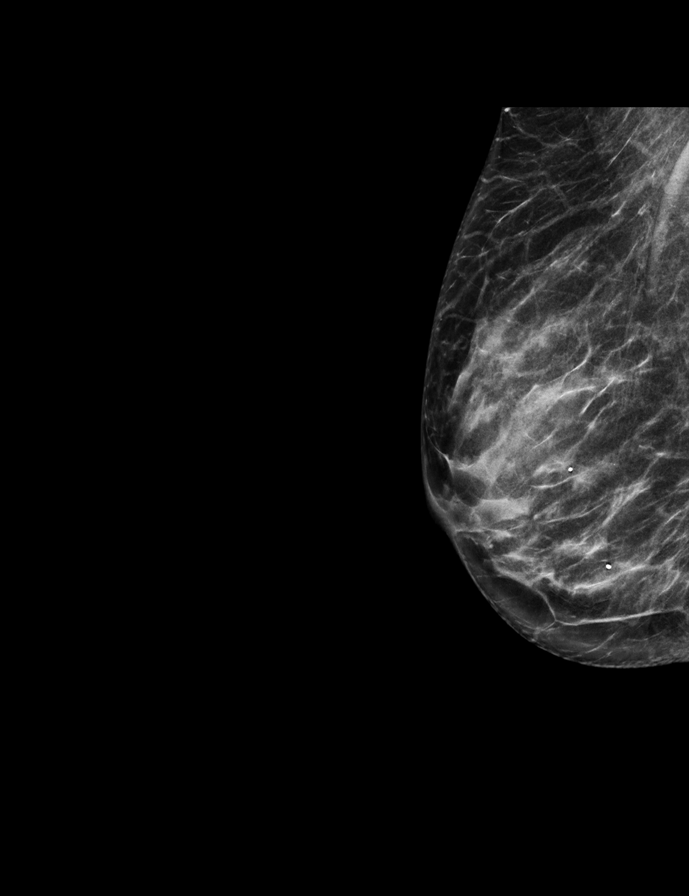

[L MLO synth-2D]
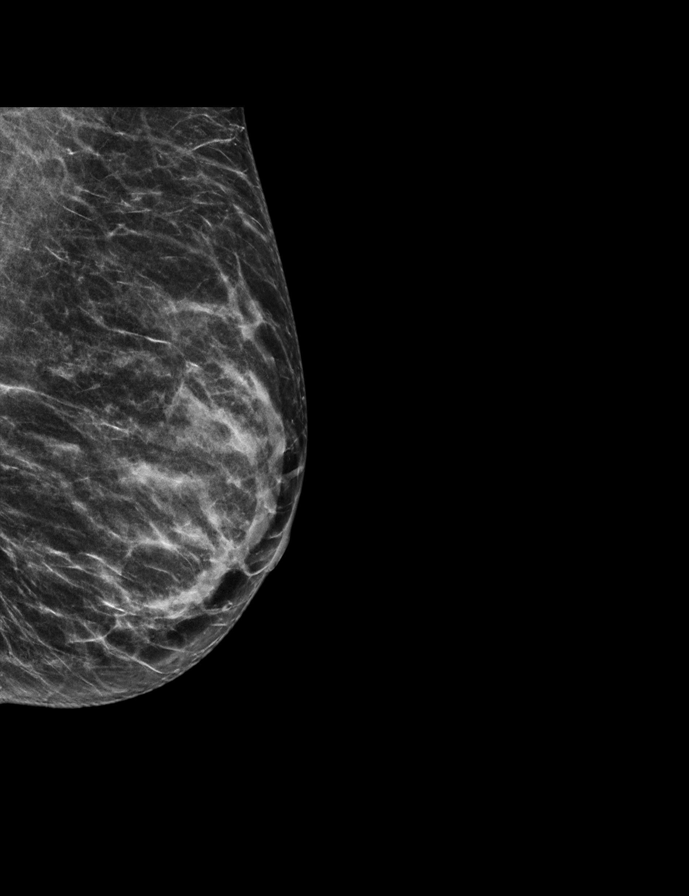

[R CC synth-2D]
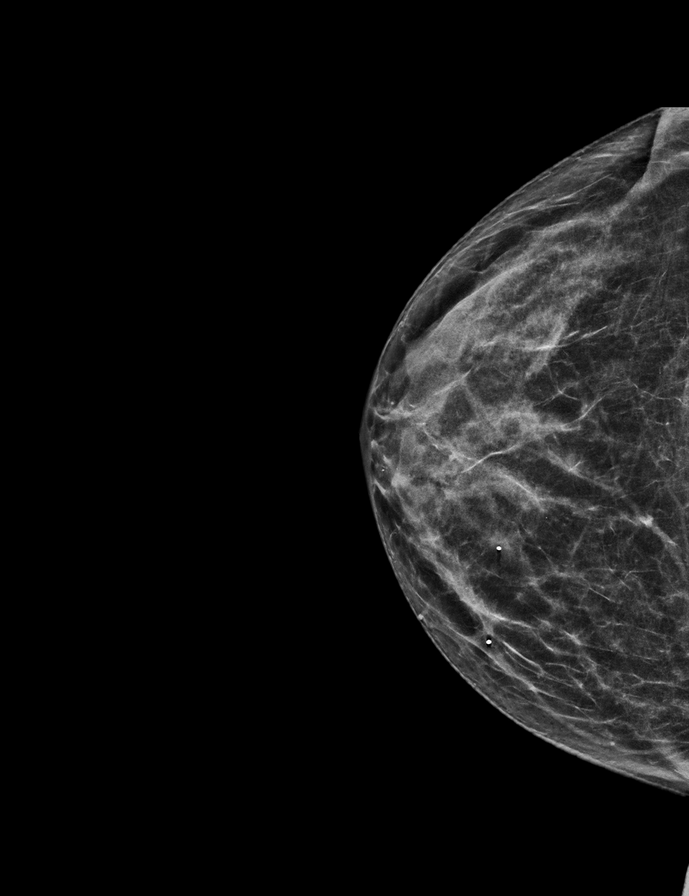

[L CC synth-2D]
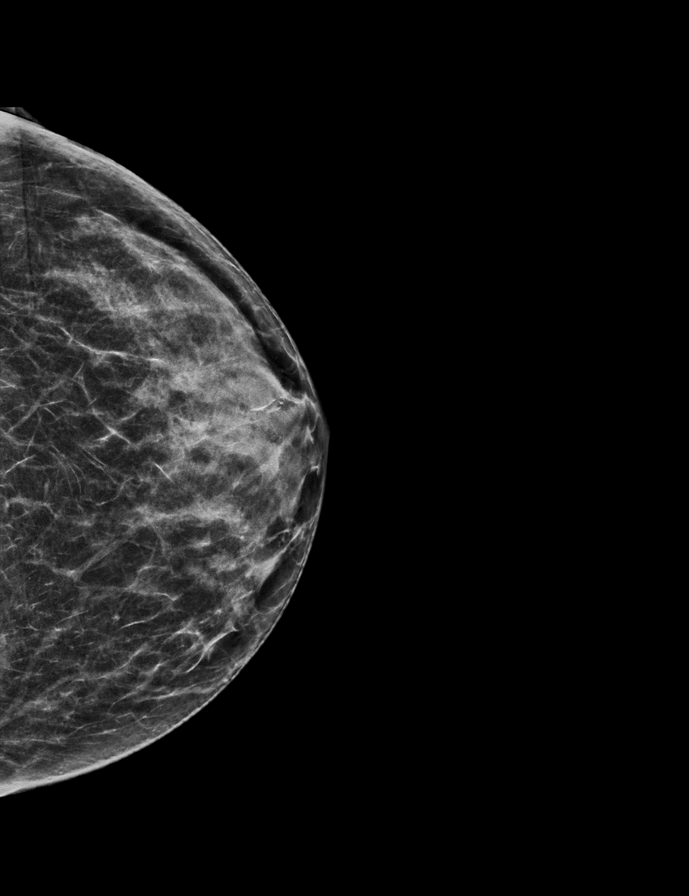

[R CC tomo · 2 of 49 frames shown]
[frame 16/49]
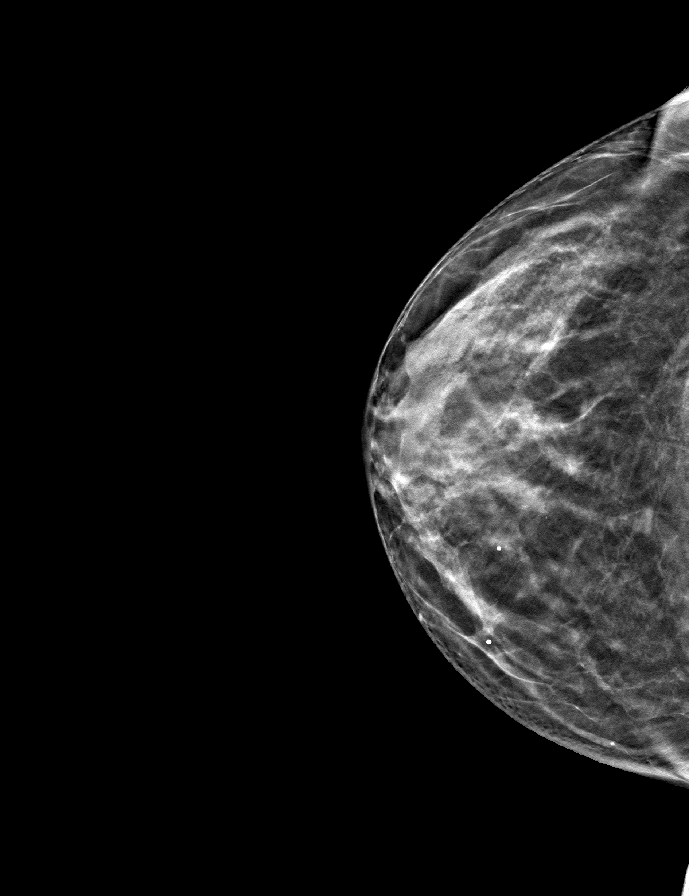
[frame 25/49]
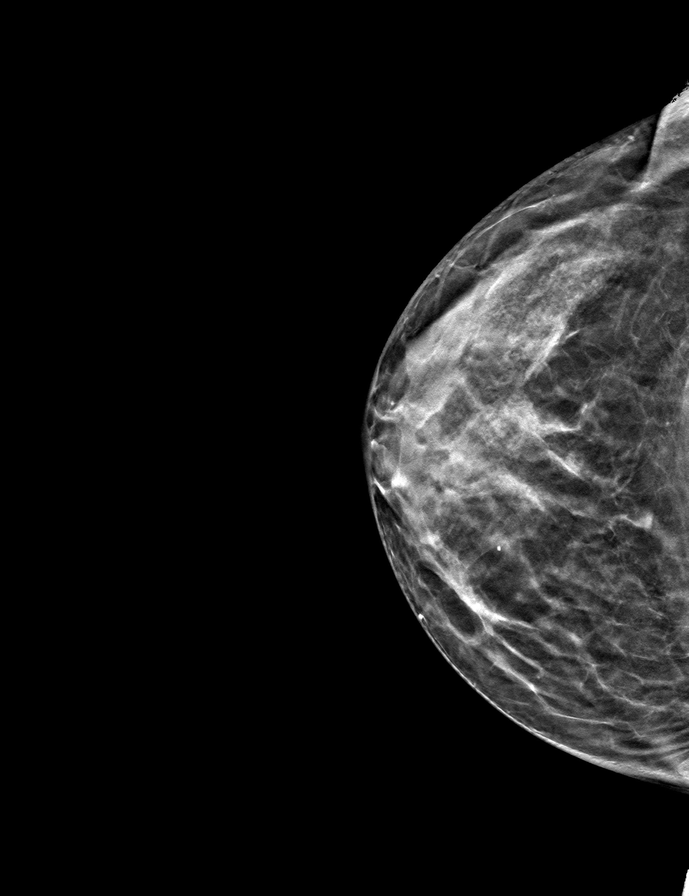

[R MLO tomo · tomo slice 29/57.0]
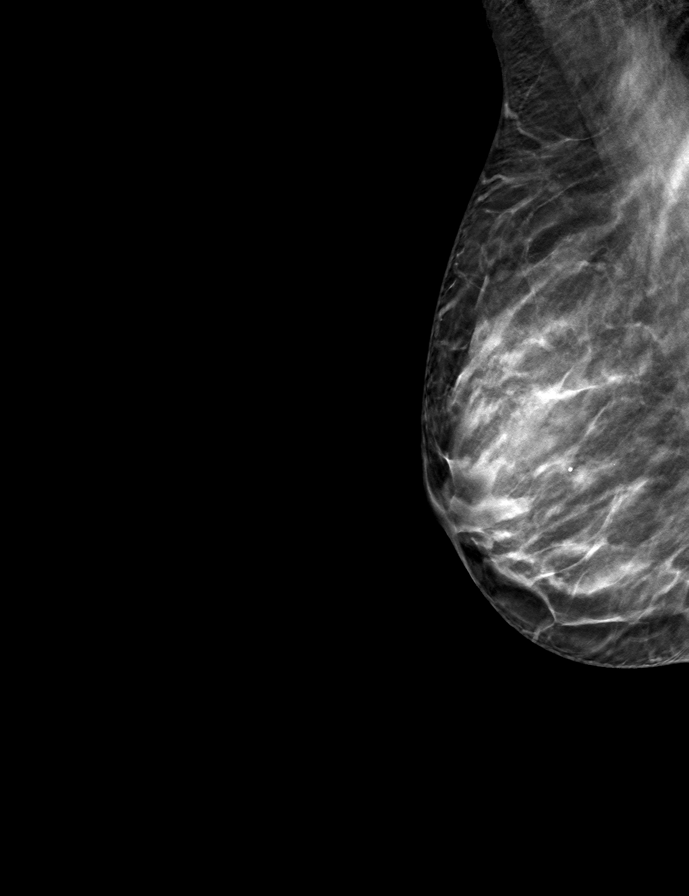

[L CC tomo · tomo slice 25/49.0]
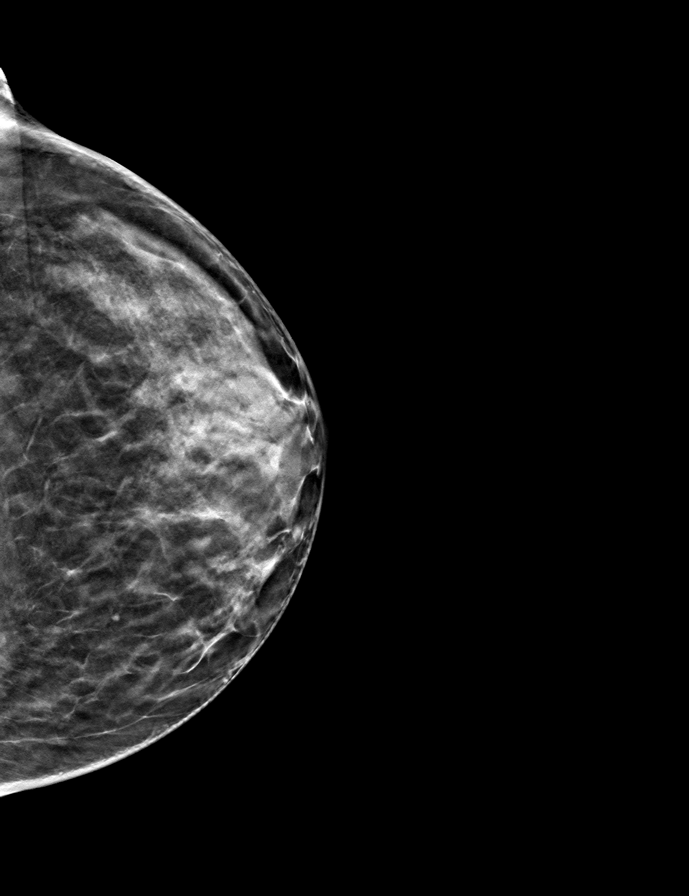

[L MLO tomo · tomo slice 24/47.0]
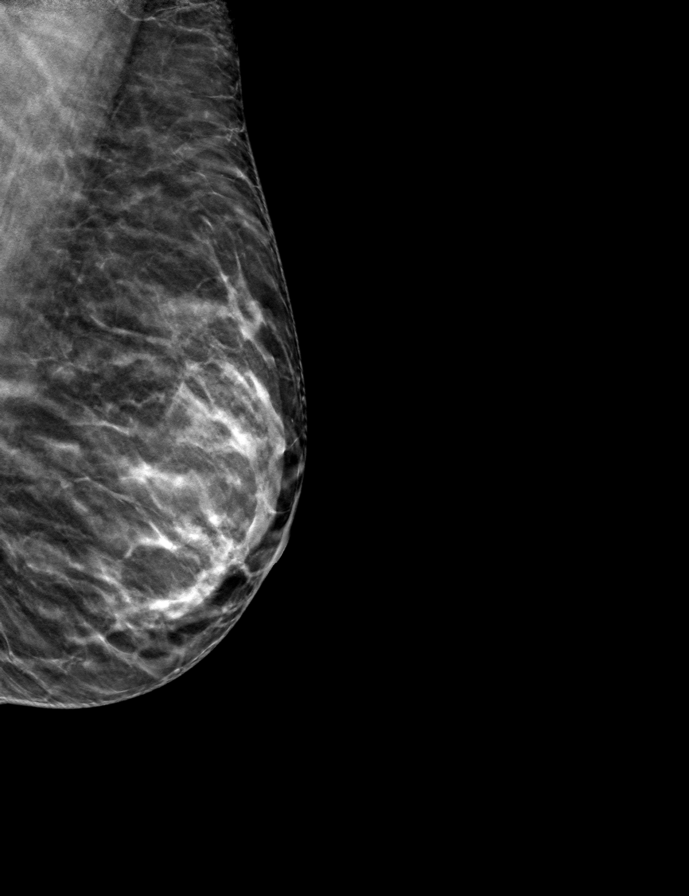

[9 of 24 positions shown; findings below may reference images not displayed]

ACR Breast Density Category c: The breast tissue is heterogeneously
dense, which may obscure small masses.
FINDINGS: There are no findings suspicious for malignancy.
IMPRESSION: No mammographic evidence of malignancy. A result letter of this
screening mammogram will be mailed directly to the patient.

RECOMMENDATION:
Screening mammogram in one year. (Code:Q3-W-BC3)

BI-RADS CATEGORY  1: Negative.

## 2022-08-23 ENCOUNTER — Ambulatory Visit: Payer: Medicare Other | Admitting: Orthopedic Surgery

## 2022-08-23 ENCOUNTER — Ambulatory Visit (INDEPENDENT_AMBULATORY_CARE_PROVIDER_SITE_OTHER): Payer: Medicare Other

## 2022-08-23 ENCOUNTER — Encounter: Payer: Self-pay | Admitting: Orthopedic Surgery

## 2022-08-23 VITALS — Ht 64.0 in | Wt 135.0 lb

## 2022-08-23 DIAGNOSIS — M25562 Pain in left knee: Secondary | ICD-10-CM | POA: Diagnosis not present

## 2022-08-23 DIAGNOSIS — M25462 Effusion, left knee: Secondary | ICD-10-CM

## 2022-08-23 MED ORDER — BUPIVACAINE HCL 0.25 % IJ SOLN
4.0000 mL | INTRAMUSCULAR | Status: AC | PRN
  Administered 2022-08-23: 4 mL via INTRA_ARTICULAR

## 2022-08-23 MED ORDER — LIDOCAINE HCL 1 % IJ SOLN
5.0000 mL | INTRAMUSCULAR | Status: AC | PRN
  Administered 2022-08-23: 5 mL

## 2022-08-23 MED ORDER — METHYLPREDNISOLONE ACETATE 40 MG/ML IJ SUSP
40.0000 mg | INTRAMUSCULAR | Status: AC | PRN
  Administered 2022-08-23: 40 mg via INTRA_ARTICULAR

## 2022-08-23 NOTE — Progress Notes (Signed)
Office Visit Note   Patient: Alyssa Lamb           Date of Birth: [birth year]           MRN: [id number] Visit Date: 08/23/2022 Requested by: Hoyt Koch, MD [address] [place],  [place] [address] PCP: Hoyt Koch, MD  Subjective: Chief Complaint  Patient presents with   Left Knee - Pain    HPI: Alyssa Lamb is a 66 y.o. female who presents to the office complaining of left knee pain.  Patient states that she has had left knee pain for the last 2 weeks ever since trying a aerobics class.  She twisted her left knee that caused a "uncomfortable" feeling in her left knee.  She was able to finish her workout but symptoms progressively worsened over the rest of the day.  Since then, she has had primarily medial sided pain with mechanical symptoms including catching and popping of the left knee.  No locking symptoms.  Pain is waking her up at night at times.  She has tried Tylenol and vitamins/supplements without any relief.  Never had prior surgery to this knee.  Never really had any significant injury to this knee but she has had occasional discomfort with stairs.  Her current symptoms are much worse than they normally are.  She has been try to take care of her body as best as she can, stretching every morning for the last 30 years and walking every day but she has not been able to do her daily walking routine for the last 1.5 weeks.  Very healthy and specifically denies any smoking history, diabetes, hypertension, blood thinner use.  She enjoys gardening and golfing and is currently retired.  No other joints are bothering her currently..                ROS: All systems reviewed are negative as they relate to the chief complaint within the history of present illness.  Patient denies fevers or chills.  Assessment & Plan: Visit Diagnoses:  1. Effusion, left knee   2. Left knee pain, unspecified chronicity     Plan: Patient is a 66 year old female who presents for  evaluation of left knee pain.  She has increased knee pain ever since an event during an aerobics class.  She has effusion with medial joint line tenderness noted on exam.  She also has catching sensation and pain that is waking her up and keeping her from doing activities that she normally does such as golfing and walking.  After discussion of options, she would like to try cortisone injection and aspiration today.  She tolerated the procedure well.  Plan for her to call the office in 2 weeks if she has no improvement in the neck step would be to order MRI of the left knee for further evaluation of medial meniscal pathology.  Follow-Up Instructions: No follow-ups on file.   Orders:  Orders Placed This Encounter  Procedures   XR KNEE 3 VIEW LEFT   No orders of the defined types were placed in this encounter.     Procedures: Large Joint Inj: L knee on 08/23/2022 11:36 AM Indications: diagnostic evaluation, joint swelling and pain Details: 18 G 1.5 in needle, superolateral approach  Arthrogram: No  Medications: 5 mL lidocaine 1 %; 40 mg methylPREDNISolone acetate 40 MG/ML; 4 mL bupivacaine 0.25 % Outcome: tolerated well, no immediate complications Procedure, treatment alternatives, risks and benefits explained, specific risks discussed. Consent  was given by the patient. Immediately prior to procedure a time out was called to verify the correct patient, procedure, equipment, support staff and site/side marked as required. Patient was prepped and draped in the usual sterile fashion.       Clinical Data: No additional findings.  Objective: Vital Signs: Ht '5\' 4"'$  (1.626 m)   Wt 135 lb (61.2 kg)   BMI 23.17 kg/m   Physical Exam:  Constitutional: Patient appears well-developed HEENT:  Head: Normocephalic Eyes:EOM are normal Neck: Normal range of motion Cardiovascular: Normal rate Pulmonary/chest: Effort normal Neurologic: Patient is alert Skin: Skin is warm Psychiatric: Patient  has normal mood and affect  Ortho Exam: Ortho exam demonstrates left knee with positive effusion.  Tenderness over the medial joint line moderately and lateral joint line mildly.  No calf tenderness.  Negative Homans' sign.  She is able to perform straight leg raise without extensor lag.  No laxity to varus or valgus stress.  No laxity to stressing of the MPFL.  No pain with hip range of motion.  She has excellent range of motion from 0 degrees extension to 125 degrees of knee flexion.  Intact ankle dorsiflexion and plantarflexion.  Specialty Comments:  No specialty comments available.  Imaging: No results found.   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   Nephrolithiasis    bilateral   Urgency of urination    Wears glasses     Family History  Problem Relation Age of Onset   Arthritis Mother    Hyperlipidemia Mother    Heart disease Mother    Hypertension Mother    Diabetes Mother    Colon cancer Neg Hx    Esophageal cancer Neg Hx    Stomach cancer Neg Hx     Past Surgical History:  Procedure Laterality Date   CYSTOSCOPY WITH RETROGRADE PYELOGRAM, URETEROSCOPY AND STENT PLACEMENT Left 09/22/2015   Procedure: CYSTOSCOPY WITH LEFT RETROGRADE PYELOGRAM, URETEROSCOPY, STONE EXTRACTION AND STENT PLACEMENT;  Surgeon: Franchot Gallo, MD;  Location: [facility];  Service: Urology;  Laterality: Left;   HOLMIUM LASER APPLICATION Left 5/80/9983   Procedure: HOLMIUM LASER APPLICATION;  Surgeon: Franchot Gallo, MD;  Location: [facility];  Service: Urology;  Laterality: Left;    NEPHROLITHOTOMY  1975   ORIF RIGHT ANKLE FX  1967   RIGHT SHOULDER SURGERY  2013   repair nerve   ROTATOR CUFF REPAIR Left    TUBAL LIGATION  1986   Social History   Occupational History   Not on file  Tobacco Use   Smoking status: Former    Years: 8.00    Types: Cigarettes    Quit date: 09/18/1977    Years since quitting: 44.9   Smokeless tobacco: Never  Substance and Sexual Activity   Alcohol use: Yes    Alcohol/week: 2.0 standard drinks of alcohol    Types: 2 Glasses of wine per week   Drug use: No   Sexual activity: Not on file

## 2022-09-05 ENCOUNTER — Encounter: Payer: Self-pay | Admitting: Surgical

## 2022-09-05 ENCOUNTER — Ambulatory Visit: Payer: Medicare Other | Admitting: Surgical

## 2022-09-05 DIAGNOSIS — M25562 Pain in left knee: Secondary | ICD-10-CM | POA: Diagnosis not present

## 2022-09-05 NOTE — Progress Notes (Signed)
Office Visit Note   Patient: Alyssa Lamb           Date of Birth: April 17, 1956           MRN: [id number] Visit Date: 09/05/2022 Requested by: Hoyt Koch, MD [address] [place],  [place] [address] PCP: Hoyt Koch, MD  Subjective: Chief Complaint  Patient presents with   Left Knee - Follow-up    HPI: Alyssa Lamb is a 66 y.o. female who presents to the office complaining of continued left knee pain.  Patient returns after left knee injection on 08/23/2022.  She notes about 15 to 20% improvement for 5 days but now she has returned back to how her pain was prior to the injection.  She notes continued catching and clicking sensation primarily in the medial aspect of the knee.  She does not wake with pain and states that her knee overall feels pretty good in the morning.  She will stretch every morning and then as she goes about her day, pain continues to get worse.  She ices her knee for about 1 hour around 2 to 3 PM.  Has not really been able to go back to walking the distances that she normally does.  She has golfed 3 times since her last appointment and this has led to worse radiating pain from the knee down the shin.  Cannot really pivot at all on the left knee without the knee feeling very uncomfortable for her and feels like it wants to give out..                ROS: All systems reviewed are negative as they relate to the chief complaint within the history of present illness.  Patient denies fevers or chills.  Assessment & Plan: Visit Diagnoses:  1. Medial joint line tenderness of left knee     Plan: Patient is a 66 year old female who presents for reevaluation following left knee injection on 08/23/2022.  Only 20% relief from the injection for about 5 days.  She has continued symptoms including mechanical symptoms with catching in the medial aspect of the knee.  Has not been able to get back to the activities that she would like to do.  Never really had trouble  with this knee before.  Plan to order MRI of the left knee for further evaluation of medial meniscal tear.  Follow-up after MRI to review results.  Follow-Up Instructions: No follow-ups on file.   Orders:  No orders of the defined types were placed in this encounter.  No orders of the defined types were placed in this encounter.     Procedures: No procedures performed   Clinical Data: No additional findings.  Objective: Vital Signs: There were no vitals taken for this visit.  Physical Exam:  Constitutional: Patient appears well-developed HEENT:  Head: Normocephalic Eyes:EOM are normal Neck: Normal range of motion Cardiovascular: Normal rate Pulmonary/chest: Effort normal Neurologic: Patient is alert Skin: Skin is warm Psychiatric: Patient has normal mood and affect  Ortho Exam: Ortho exam demonstrates left knee without effusion.  Tenderness over the medial joint line.  No significant tenderness over the lateral joint line.  She has excellent range of motion with 0 degrees extension and 120 degrees knee flexion.  No pain with hip range of motion.  Able to perform straight leg raise.  Stable to anterior posterior drawer sign.  Positive McMurray's sign medially.  Specialty Comments:  No specialty comments available.  Imaging: No  results found.   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   Nephrolithiasis    bilateral   Urgency of urination    Wears glasses     Family History  Problem Relation Age of Onset   Arthritis Mother    Hyperlipidemia Mother    Heart disease Mother    Hypertension Mother     Diabetes Mother    Colon cancer Neg Hx    Esophageal cancer Neg Hx    Stomach cancer Neg Hx     Past Surgical History:  Procedure Laterality Date   CYSTOSCOPY WITH RETROGRADE PYELOGRAM, URETEROSCOPY AND STENT PLACEMENT Left 09/22/2015   Procedure: CYSTOSCOPY WITH LEFT RETROGRADE PYELOGRAM, URETEROSCOPY, STONE EXTRACTION AND STENT PLACEMENT;  Surgeon: Franchot Gallo, MD;  Location: [facility];  Service: Urology;  Laterality: Left;   HOLMIUM LASER APPLICATION Left 9/62/2297   Procedure: HOLMIUM LASER APPLICATION;  Surgeon: Franchot Gallo, MD;  Location[facility];  Service: Urology;  Laterality: Left;   NEPHROLITHOTOMY  1975   ORIF RIGHT ANKLE FX  1967   RIGHT SHOULDER SURGERY  2013   repair nerve   ROTATOR CUFF REPAIR Left    TUBAL LIGATION  1986   Social History   Occupational History   Not on file  Tobacco Use   Smoking status: Former    Years: 8.00    Types: Cigarettes    Quit date: 09/18/1977    Years since quitting: 44.9   Smokeless tobacco: Never  Substance and Sexual Activity   Alcohol use: Yes    Alcohol/week: 2.0 standard drinks of alcohol    Types: 2 Glasses of wine per week   Drug use: No   Sexual activity: Not on file

## 2022-09-06 ENCOUNTER — Other Ambulatory Visit: Payer: Self-pay

## 2022-09-06 DIAGNOSIS — M25562 Pain in left knee: Secondary | ICD-10-CM

## 2022-09-06 DIAGNOSIS — M25462 Effusion, left knee: Secondary | ICD-10-CM

## 2022-09-20 ENCOUNTER — Encounter: Payer: Self-pay | Admitting: Internal Medicine

## 2022-09-26 NOTE — Telephone Encounter (Signed)
Spoke to pt stated-much better, no pain/swelling-but still little lump in arm pit.

## 2022-10-02 ENCOUNTER — Encounter: Payer: Self-pay | Admitting: Internal Medicine

## 2022-11-15 ENCOUNTER — Encounter: Payer: Self-pay | Admitting: Orthopedic Surgery

## 2022-11-24 ENCOUNTER — Ambulatory Visit
Admission: RE | Admit: 2022-11-24 | Discharge: 2022-11-24 | Disposition: A | Discharge status: Home / Self Care | Payer: Medicare Other | Source: Ambulatory Visit | Attending: Surgical | Admitting: Surgical

## 2022-11-24 DIAGNOSIS — M23222 Derangement of posterior horn of medial meniscus due to old tear or injury, left knee: Secondary | ICD-10-CM | POA: Diagnosis not present

## 2022-11-24 DIAGNOSIS — M7122 Synovial cyst of popliteal space [Baker], left knee: Secondary | ICD-10-CM | POA: Diagnosis not present

## 2022-11-24 DIAGNOSIS — M1712 Unilateral primary osteoarthritis, left knee: Secondary | ICD-10-CM | POA: Diagnosis not present

## 2022-11-24 DIAGNOSIS — M25462 Effusion, left knee: Secondary | ICD-10-CM | POA: Diagnosis not present

## 2022-11-24 DIAGNOSIS — M25562 Pain in left knee: Secondary | ICD-10-CM

## 2022-11-28 ENCOUNTER — Ambulatory Visit: Payer: Medicare Other | Admitting: Orthopedic Surgery

## 2022-11-28 ENCOUNTER — Telehealth: Payer: Self-pay

## 2022-11-28 DIAGNOSIS — M25462 Effusion, left knee: Secondary | ICD-10-CM

## 2022-11-28 DIAGNOSIS — M25562 Pain in left knee: Secondary | ICD-10-CM

## 2022-11-28 NOTE — Telephone Encounter (Signed)
Auth needed for left knee gel injection

## 2022-11-28 NOTE — Telephone Encounter (Signed)
Will submit 12/26/2022

## 2022-12-01 ENCOUNTER — Encounter: Payer: Self-pay | Admitting: Orthopedic Surgery

## 2022-12-01 NOTE — Progress Notes (Signed)
Office Visit Note   Patient: Alyssa Lamb           Date of Birth: Nov 14, 1956           MRN: [id number] Visit Date: 11/28/2022 Requested by: Hoyt Koch, MD [address] [place],  [place] [address] PCP: Hoyt Koch, MD  Subjective: Chief Complaint  Patient presents with   Other     Scan review    HPI: Alyssa Lamb is a 66 y.o. female who presents to the office for MRI review. Patient denies any changes in symptoms.  Continues to complain mainly of left knee pain primarily in the medial aspect of the knee with associated catching sensation at times in the medial aspect.  She describes and "uncomfortable" sensation in the left knee.  No new injury.  She did fairly well with cortisone injection that actually lasted for couple months before pain returned and she decided to go ahead with the MRI scan.  MRI results revealed: MR Knee Left w/o contrast  Result Date: 11/25/2022 CLINICAL DATA:  Bilateral knee pain. Twisting injury 4 months ago. No previous relevant surgery. EXAM: MRI OF THE LEFT KNEE WITHOUT CONTRAST TECHNIQUE: Multiplanar, multisequence MR imaging of the knee was performed. No intravenous contrast was administered. COMPARISON:  Radiographs 08/23/2022 FINDINGS: MENISCI Medial meniscus: Mild degeneration of the posterior horn and body without definite meniscal tear. A tiny flap fragment in the meniscotibial recess on coronal image 14/8 is difficult to exclude. No centrally displaced meniscal fragment. The meniscal root is intact. Lateral meniscus:  Intact with normal morphology. LIGAMENTS Cruciates:  Intact. Collaterals: Intact. Small amount of fluid within the pes anserine bursa. CARTILAGE Patellofemoral: Moderate patellofemoral degenerative changes with diffuse chondral thinning, surface irregularity and subchondral edema involving the patellar apex and medial facet. The trochlear cartilage is relatively preserved. Medial:  Mild chondral thinning without  focal defect. Lateral:  Preserved. MISCELLANEOUS Joint:  Small to moderate knee joint effusion. Popliteal Fossa: The popliteus muscle and tendon are intact. Small Baker's cyst which may be partially ruptured. There is mild edema tracking inferiorly along the superficial aspect of the medial head of the gastrocnemius muscle. Extensor Mechanism:  Intact. Bones:  No acute or significant extra-articular osseous findings. Other: No other significant periarticular soft tissue findings. IMPRESSION: 1. Moderate patellofemoral and mild medial compartment degenerative changes. No acute osseous findings. 2. Mild degeneration of the posterior horn and body of the medial meniscus without definite meniscal tear. A tiny flap fragment in the meniscotibial recess is difficult to exclude. 3. The lateral meniscus, cruciate and collateral ligaments are intact. 4. Small to moderate knee joint effusion and small Baker's cyst which may be partially ruptured. Electronically Signed   By: Richardean Sale M.D.   On: 11/25/2022 16:48                 ROS: All systems reviewed are negative as they relate to the chief complaint within the history of present illness.  Patient denies fevers or chills.  Assessment & Plan: Visit Diagnoses:  1. Left knee pain, unspecified chronicity     Plan: Alyssa Lamb is a 66 y.o. female who presents to the office for reevaluation of left knee pain and review of MRI scan.  Most of her pain is in the medial aspect of the knee and she has MRI findings demonstrating mild degenerative changes in the medial compartment with degeneration of the medial meniscus without tear but there is a small flap of  meniscal tissue that looks to be protruding into the meniscotibial recess.  This may be reducing out of the recess with knee range of motion and may be responsible for her catching sensation.  Discussed the options available to patient including living with her symptoms versus cortisone injection versus gel  injection versus arthroscopic debridement of this meniscal tissue.  After discussion, she would like to try cortisone injection today which gave her pretty good relief and preapproved for left knee gel injection to see if this will help in the future once the cortisone injection wears off.  Tolerated cortisone injection well today.  Follow-up with the office after gel injection approval.  This patient is diagnosed with osteoarthritis of the knee(s).    Radiographs show evidence of joint space narrowing, osteophytes, subchondral sclerosis and/or subchondral cysts.  This patient has knee pain which interferes with functional and activities of daily living.    This patient has experienced inadequate response, adverse effects and/or intolerance with conservative treatments such as acetaminophen, NSAIDS, topical creams, physical therapy or regular exercise, knee bracing and/or weight loss.   This patient has experienced inadequate response or has a contraindication to intra articular steroid injections for at least 3 months.   This patient is not scheduled to have a total knee replacement within 6 months of starting treatment with viscosupplementation.   Follow-Up Instructions: No follow-ups on file.   Orders:  No orders of the defined types were placed in this encounter.  No orders of the defined types were placed in this encounter.     Procedures: Large Joint Inj: L knee on 11/28/2022 11:56 AM Indications: diagnostic evaluation, joint swelling and pain Details: 18 G 1.5 in needle, superolateral approach  Arthrogram: No  Medications: 5 mL lidocaine 1 %; 40 mg methylPREDNISolone acetate 40 MG/ML; 4 mL bupivacaine 0.25 % Outcome: tolerated well, no immediate complications Procedure, treatment alternatives, risks and benefits explained, specific risks discussed. Consent was given by the patient. Immediately prior to procedure a time out was called to verify the correct patient, procedure,  equipment, support staff and site/side marked as required. Patient was prepped and draped in the usual sterile fashion.       Clinical Data: No additional findings.  Objective: Vital Signs: There were no vitals taken for this visit.  Physical Exam:  Constitutional: Patient appears well-developed HEENT:  Head: Normocephalic Eyes:EOM are normal Neck: Normal range of motion Cardiovascular: Normal rate Pulmonary/chest: Effort normal Neurologic: Patient is alert Skin: Skin is warm Psychiatric: Patient has normal mood and affect  Ortho Exam: Ortho exam demonstrates left knee with positive effusion.  Tenderness over the medial joint line moderately.  Really no significant tenderness over the lateral joint line.  She has mildly positive patellar grind test.  Patellofemoral crepitus is noted.  She is able to perform straight leg raise without extensor lag.  0 degrees extension and 120 degrees knee flexion.  No calf tenderness.  Negative Homans' sign.  No pain with hip range of motion.  No tenderness over the pes anserine bursa.  Specialty Comments:  No specialty comments available.  Imaging: No results found.   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   Nephrolithiasis    bilateral   Urgency of urination    Wears glasses     Family History  Problem Relation Age of Onset   Arthritis Mother    Hyperlipidemia Mother    Heart disease Mother    Hypertension Mother    Diabetes Mother    Colon cancer Neg Hx    Esophageal cancer Neg Hx    Stomach cancer Neg Hx     Past Surgical History:  Procedure Laterality Date    CYSTOSCOPY WITH RETROGRADE PYELOGRAM, URETEROSCOPY AND STENT PLACEMENT Left 09/22/2015   Procedure: CYSTOSCOPY WITH LEFT RETROGRADE PYELOGRAM, URETEROSCOPY, STONE EXTRACTION AND STENT PLACEMENT;  Surgeon: Franchot Gallo, MD;  Location: [facility];  Service: Urology;  Laterality: Left;   HOLMIUM LASER APPLICATION Left 4/85/4627   Procedure: HOLMIUM LASER APPLICATION;  Surgeon: Franchot Gallo, MD;  Location: [facility];  Service: Urology;  Laterality: Left;   NEPHROLITHOTOMY  1975   ORIF RIGHT ANKLE FX  1967   RIGHT SHOULDER SURGERY  2013   repair nerve   ROTATOR CUFF REPAIR Left    TUBAL LIGATION  1986   Social History   Occupational History   Not on file  Tobacco Use   Smoking status: Former    Years: 8.00    Types: Cigarettes    Quit date: 09/18/1977    Years since quitting: 45.2   Smokeless tobacco: Never  Substance and Sexual Activity   Alcohol use: Yes    Alcohol/week: 2.0 standard drinks of alcohol    Types: 2 Glasses of wine per week   Drug use: No   Sexual activity: Not on file

## 2022-12-08 ENCOUNTER — Encounter: Payer: Self-pay | Admitting: Orthopedic Surgery

## 2022-12-08 MED ORDER — LIDOCAINE HCL 1 % IJ SOLN
5.0000 mL | INTRAMUSCULAR | Status: AC | PRN
  Administered 2022-11-28: 5 mL

## 2022-12-08 MED ORDER — BUPIVACAINE HCL 0.25 % IJ SOLN
4.0000 mL | INTRAMUSCULAR | Status: AC | PRN
  Administered 2022-11-28: 4 mL via INTRA_ARTICULAR

## 2022-12-08 MED ORDER — METHYLPREDNISOLONE ACETATE 40 MG/ML IJ SUSP
40.0000 mg | INTRAMUSCULAR | Status: AC | PRN
  Administered 2022-11-28: 40 mg via INTRA_ARTICULAR

## 2023-01-14 ENCOUNTER — Telehealth: Payer: Self-pay

## 2023-01-14 NOTE — Telephone Encounter (Signed)
VOB submitted for Monovisc, left knee 

## 2023-02-07 ENCOUNTER — Other Ambulatory Visit: Payer: Self-pay

## 2023-02-07 DIAGNOSIS — M25562 Pain in left knee: Secondary | ICD-10-CM

## 2023-02-26 ENCOUNTER — Ambulatory Visit: Payer: Medicare (Managed Care) | Admitting: Orthopedic Surgery

## 2023-05-13 ENCOUNTER — Encounter: Payer: Self-pay | Admitting: Gastroenterology

## 2023-11-07 LAB — HM MAMMOGRAPHY

## 2024-05-05 DIAGNOSIS — R109 Unspecified abdominal pain: Secondary | ICD-10-CM | POA: Diagnosis not present

## 2024-05-05 DIAGNOSIS — N200 Calculus of kidney: Secondary | ICD-10-CM | POA: Diagnosis not present

## 2024-05-11 ENCOUNTER — Ambulatory Visit (INDEPENDENT_AMBULATORY_CARE_PROVIDER_SITE_OTHER): Admitting: Internal Medicine

## 2024-05-11 ENCOUNTER — Encounter: Admitting: Internal Medicine

## 2024-05-11 ENCOUNTER — Encounter: Payer: Self-pay | Admitting: Internal Medicine

## 2024-05-11 VITALS — BP 128/80 | HR 68 | Temp 98.3°F | Ht 64.0 in | Wt 169.0 lb

## 2024-05-11 DIAGNOSIS — K635 Polyp of colon: Secondary | ICD-10-CM | POA: Diagnosis not present

## 2024-05-11 DIAGNOSIS — Z0000 Encounter for general adult medical examination without abnormal findings: Secondary | ICD-10-CM | POA: Diagnosis not present

## 2024-05-11 DIAGNOSIS — M8589 Other specified disorders of bone density and structure, multiple sites: Secondary | ICD-10-CM | POA: Diagnosis not present

## 2024-05-11 DIAGNOSIS — Q639 Congenital malformation of kidney, unspecified: Secondary | ICD-10-CM | POA: Diagnosis not present

## 2024-05-11 MED ORDER — TIZANIDINE HCL 4 MG PO TABS: 4.0000 mg | ORAL_TABLET | Freq: Four times a day (QID) | ORAL | 0 refills | Status: DC | PRN

## 2024-05-11 NOTE — Assessment & Plan Note (Signed)
 Recent renal function stable. Continue to monitor BP and labs intermittently.

## 2024-05-11 NOTE — Patient Instructions (Addendum)
 We have sent in the tizanidine to use for the back.  We will get you in for the colonoscopy and bone density.

## 2024-05-11 NOTE — Progress Notes (Signed)
   Subjective:   Patient ID: Alyssa Lamb, female    DOB: Aug 03, 1956, 68 y.o.   MRN: [id number]  HPI The patient is here for physical.  PMH, [facility], social history reviewed and updated  Review of Systems  Constitutional: Negative.   HENT: Negative.    Eyes: Negative.   Respiratory:  Negative for cough, chest tightness and shortness of breath.   Cardiovascular:  Negative for chest pain, palpitations and leg swelling.  Gastrointestinal:  Negative for abdominal distention, abdominal pain, constipation, diarrhea, nausea and vomiting.  Musculoskeletal: Negative.   Skin: Negative.   Neurological: Negative.   Psychiatric/Behavioral: Negative.      Objective:  Physical Exam Constitutional:      Appearance: She is well-developed.  HENT:     Head: Normocephalic and atraumatic.  Cardiovascular:     Rate and Rhythm: Normal rate and regular rhythm.  Pulmonary:     Effort: Pulmonary effort is normal. No respiratory distress.     Breath sounds: Normal breath sounds. No wheezing or rales.  Abdominal:     General: Bowel sounds are normal. There is no distension.     Palpations: Abdomen is soft.     Tenderness: There is no abdominal tenderness. There is no rebound.  Musculoskeletal:     Cervical back: Normal range of motion.  Skin:    General: Skin is warm and dry.  Neurological:     Mental Status: She is alert and oriented to person, place, and time.     Coordination: Coordination normal.     Vitals:   05/11/24 1555  BP: 128/80  Pulse: 68  Temp: 98.3 F (36.8 C)  TempSrc: Oral  SpO2: 98%  Weight: 169 lb (76.7 kg)  Height: 5\' 4"  (1.626 m)    Assessment & Plan:

## 2024-05-11 NOTE — Assessment & Plan Note (Signed)
 Flu shot yearly. Pneumonia likely complete. Shingrix  complete. Tetanus up to date. Colonoscopy due referral placed. Mammogram up to date, pap smear aged out and dexa due ordered. Counseled about sun safety and mole surveillance. Counseled about the dangers of distracted driving. Given 10 year screening recommendations.

## 2024-05-26 ENCOUNTER — Ambulatory Visit
Admission: RE | Admit: 2024-05-26 | Discharge: 2024-05-26 | Disposition: A | Discharge status: Home / Self Care | Source: Ambulatory Visit | Attending: Internal Medicine | Admitting: Internal Medicine

## 2024-05-26 DIAGNOSIS — M8589 Other specified disorders of bone density and structure, multiple sites: Secondary | ICD-10-CM | POA: Diagnosis not present

## 2024-06-04 ENCOUNTER — Ambulatory Visit: Payer: Self-pay | Admitting: Internal Medicine

## 2024-06-14 ENCOUNTER — Other Ambulatory Visit: Payer: Self-pay | Admitting: Internal Medicine

## 2024-06-14 DIAGNOSIS — Z1231 Encounter for screening mammogram for malignant neoplasm of breast: Secondary | ICD-10-CM

## 2024-06-15 LAB — HM DEXA SCAN: HM Dexa Scan: -2.1

## 2024-06-16 ENCOUNTER — Ambulatory Visit
Admission: RE | Admit: 2024-06-16 | Discharge: 2024-06-16 | Disposition: A | Discharge status: Home / Self Care | Source: Ambulatory Visit | Attending: Internal Medicine | Admitting: Internal Medicine

## 2024-06-16 DIAGNOSIS — Z1231 Encounter for screening mammogram for malignant neoplasm of breast: Secondary | ICD-10-CM

## 2024-06-23 DIAGNOSIS — H15019 Anterior scleritis, unspecified eye: Secondary | ICD-10-CM | POA: Diagnosis not present

## 2024-06-28 DIAGNOSIS — H2513 Age-related nuclear cataract, bilateral: Secondary | ICD-10-CM | POA: Diagnosis not present

## 2024-06-28 DIAGNOSIS — H43823 Vitreomacular adhesion, bilateral: Secondary | ICD-10-CM | POA: Diagnosis not present

## 2024-06-28 DIAGNOSIS — H33191 Other retinoschisis and retinal cysts, right eye: Secondary | ICD-10-CM | POA: Diagnosis not present

## 2024-06-28 DIAGNOSIS — H35363 Drusen (degenerative) of macula, bilateral: Secondary | ICD-10-CM | POA: Diagnosis not present

## 2024-07-05 ENCOUNTER — Encounter: Payer: Self-pay | Admitting: Gastroenterology

## 2024-07-14 ENCOUNTER — Encounter: Payer: Self-pay | Admitting: Internal Medicine

## 2024-07-15 NOTE — Telephone Encounter (Signed)
**Note De-identified  [place]bright Obfuscation** Please advise 

## 2024-07-22 ENCOUNTER — Telehealth: Payer: Self-pay

## 2024-07-22 ENCOUNTER — Encounter

## 2024-07-22 NOTE — Telephone Encounter (Signed)
 Called back again with no answer.  Phone call goes directly to voicemail.  Left message for the patient to call back and reschedule the PV.  Explained that the PV was mandatory in order to get instructions on preparing for the colonoscopy. Left phone number and will try back in a few minutes

## 2024-07-27 ENCOUNTER — Ambulatory Visit (AMBULATORY_SURGERY_CENTER): Admitting: *Deleted

## 2024-07-27 ENCOUNTER — Encounter: Payer: Self-pay | Admitting: Gastroenterology

## 2024-07-27 VITALS — Ht 64.0 in | Wt 150.0 lb

## 2024-07-27 DIAGNOSIS — Z860100 Personal history of colon polyps, unspecified: Secondary | ICD-10-CM

## 2024-07-27 MED ORDER — NA SULFATE-K SULFATE-MG SULF 17.5-3.13-1.6 GM/177ML PO SOLN: 1.0000 | Freq: Once | ORAL | 0 refills | Status: AC

## 2024-07-27 NOTE — Progress Notes (Addendum)
 Pt's name and DOB verified at the beginning of the pre-visit with 2 identifiers  Permission given to speak with  Pt denies any difficulty with ambulating,sitting, laying down or rolling side to side   Pt uses ambulation assistance device or has issues with mobiiity  Pt has no issues moving head neck or swallowing  No egg or soy allergy known to patient   PONV HX  No FH of Malignant Hyperthermia  Pt is not on home 02   Pt is not on blood thinners   Pt denies issues with constipation   Pt is not on dialysis  Pt denise any abnormal heart rhythms   Pt denies any upcoming cardiac testing   Chart not reviewed by CRNA prior to PV  Visit by phon  Pt states weight is 150 lb  Instructions reviewed. Pt given  both [facility] main # and MD on call # prior to instructions.   Informed pt to come in at the time discussed and is shown on PV instructions. Pt informed that they are coming in i.e. cloths change.,IV placement , Consent signing and meeting CRNA. Pt states understanding after  given opportunity to ask questions t after all  instructions given. Instructed pt to review instructions again prior to procedure and call main # given if has any questions.. Pt states they will.   Instructed pt on where to find instructions on My Chart.

## 2024-08-02 ENCOUNTER — Telehealth: Admitting: Internal Medicine

## 2024-08-02 ENCOUNTER — Encounter: Payer: Self-pay | Admitting: Internal Medicine

## 2024-08-02 VITALS — Ht 64.0 in

## 2024-08-02 DIAGNOSIS — B351 Tinea unguium: Secondary | ICD-10-CM | POA: Insufficient documentation

## 2024-08-02 MED ORDER — CICLOPIROX 8 % EX SOLN: Freq: Every day | CUTANEOUS | 6 refills | Status: AC

## 2024-08-02 NOTE — Progress Notes (Signed)
,  Virtual Visit via Video Note  I connected with Alyssa Lamb on 08/02/24 at 10:20 AM EDT by a video enabled telemedicine application and verified that I am speaking with the correct person using two identifiers.  The patient and the provider were at separate locations throughout the entire encounter. Patient location: home, Provider location: work   I discussed the limitations of evaluation and management by telemedicine and the availability of in person appointments. The patient expressed understanding and agreed to proceed. The patient and the provider were the only parties present for the visit unless noted in HPI below.  History of Present Illness: Discussed the use of AI scribe software for clinical note transcription with the patient, who gave verbal consent to proceed.  History of Present Illness Alyssa Lamb is a 68 year old female who presents with a suspected toenail fungus on her right foot.  Over the past couple of months, she has noticed changes in her right toenail, which has become brittle, flaky, and has a shiny appearance at the root. She initially thought she might have injured it, but does not recall any specific incident. Despite trimming the nail two to three times, the deformity persists.  There is no associated pain or itching with the toenail changes. She is diligent about foot hygiene, regularly changes her socks, and avoids going barefoot in unfamiliar places. She does not paint her nails or visit salons for pedicures due to concerns about hygiene and potential infections.  Observations/Objective: Appearance: normal, breathing appears normal, casual grooming, abdomen does not appear distended, mental status is A and O times 3, right foot with some toenail deformity  Assessment and Plan Assessment & Plan Onychomycosis of right toenail   Onychomycosis has been present for several months, causing brittleness and deformity. Oral antifungals have a low cure  rate and require liver monitoring, while topical lacquer is safer with similar efficacy. Prescribed medicated toenail lacquer to be applied daily for three months, with weekly removal and reapplication. Advised on foot hygiene: keep feet clean, wear clean socks, and avoid barefoot exposure in unknown areas. Reassess in a few months to evaluate treatment efficacy.  Follow Up Instructions: counseled about treatment and efficacy. Rx ciclopirox   I discussed the assessment and treatment plan with the patient. The patient was provided an opportunity to ask questions and all were answered. The patient agreed with the plan and demonstrated an understanding of the instructions.   The patient was advised to call back or seek an in-person evaluation if the symptoms worsen or if the condition fails to improve as anticipated.  Alyssa DELENA Cleveland, MD

## 2024-08-05 ENCOUNTER — Encounter: Payer: Self-pay | Admitting: Gastroenterology

## 2024-08-05 ENCOUNTER — Ambulatory Visit (AMBULATORY_SURGERY_CENTER): Admitting: Gastroenterology

## 2024-08-05 VITALS — BP 119/72 | HR 53 | Temp 97.8°F | Resp 14 | Ht 64.0 in | Wt 150.0 lb

## 2024-08-05 DIAGNOSIS — Z860101 Personal history of adenomatous and serrated colon polyps: Secondary | ICD-10-CM

## 2024-08-05 DIAGNOSIS — K648 Other hemorrhoids: Secondary | ICD-10-CM | POA: Diagnosis not present

## 2024-08-05 DIAGNOSIS — Z1211 Encounter for screening for malignant neoplasm of colon: Secondary | ICD-10-CM | POA: Diagnosis not present

## 2024-08-05 DIAGNOSIS — Z860100 Personal history of colon polyps, unspecified: Secondary | ICD-10-CM

## 2024-08-05 DIAGNOSIS — D122 Benign neoplasm of ascending colon: Secondary | ICD-10-CM | POA: Diagnosis not present

## 2024-08-05 DIAGNOSIS — K5730 Diverticulosis of large intestine without perforation or abscess without bleeding: Secondary | ICD-10-CM | POA: Diagnosis not present

## 2024-08-05 MED ORDER — SODIUM CHLORIDE 0.9 % IV SOLN: 500.0000 mL | Freq: Once | INTRAVENOUS | Status: DC

## 2024-08-05 NOTE — Progress Notes (Signed)
 Pt's states no medical or surgical changes since previsit or office visit.

## 2024-08-05 NOTE — Patient Instructions (Signed)
 YOU HAD AN ENDOSCOPIC PROCEDURE TODAY AT THE [facility]:   Refer to the procedure report that was given to you for any specific questions about what was found during the examination.  If the procedure report does not answer your questions, please call your gastroenterologist to clarify.  If you requested that your care partner not be given the details of your procedure findings, then the procedure report has been included in a sealed envelope for you to review at your convenience later.  YOU SHOULD EXPECT: Some feelings of bloating in the abdomen. Passage of more gas than usual.  Walking can help get rid of the air that was put into your GI tract during the procedure and reduce the bloating. If you had a lower endoscopy (such as a colonoscopy or flexible sigmoidoscopy) you may notice spotting of blood in your stool or on the toilet paper. If you underwent a bowel prep for your procedure, you may not have a normal bowel movement for a few days.  Please Note:  You might notice some irritation and congestion in your nose or some drainage.  This is from the oxygen used during your procedure.  There is no need for concern and it should clear up in a day or so.  SYMPTOMS TO REPORT IMMEDIATELY:  Following lower endoscopy (colonoscopy or flexible sigmoidoscopy):  Excessive amounts of blood in the stool  Significant tenderness or worsening of abdominal pains  Swelling of the abdomen that is new, acute  Fever of 100F or higher  Resume previous diet Continue present medications Await pathology results Handouts on polyps and hemorrhoids given   For urgent or emergent issues, a gastroenterologist can be reached at any hour by calling [phone]. Do not use MyChart messaging for urgent concerns.    DIET:  We do recommend a small meal at first, but then you may proceed to your regular diet.  Drink plenty of fluids but you should avoid alcoholic beverages for 24 hours.  ACTIVITY:  You  should plan to take it easy for the rest of today and you should NOT DRIVE or use heavy machinery until tomorrow (because of the sedation medicines used during the test).    FOLLOW UP: Our staff will call the number listed on your records the next business day following your procedure.  We will call around 7:15- 8:00 am to check on you and address any questions or concerns that you may have regarding the information given to you following your procedure. If we do not reach you, we will leave a message.     If any biopsies were taken you will be contacted by phone or by letter within the next 1-3 weeks.  Please call us  at [phone] if you have not heard about the biopsies in 3 weeks.    SIGNATURES/CONFIDENTIALITY: You and/or your care partner have signed paperwork which will be entered into your electronic medical record.  These signatures attest to the fact that that the information above on your After Visit Summary has been reviewed and is understood.  Full responsibility of the confidentiality of this discharge information lies with you and/or your care-partner.

## 2024-08-05 NOTE — Op Note (Signed)
 [facility] Patient Name: Alyssa Lamb Procedure Date: 08/05/2024 7:51 AM MRN: [id number] Endoscopist: Victory L. Legrand , MD, [id number] Age: 68 Referring MD:  Date of Birth: 13-Feb-1956 Gender: Female Account #: [id number] Procedure:                Colonoscopy Indications:              Surveillance: Personal history of adenomatous                            polyps on last colonoscopy > 5 years ago                           2 SubCM tubular adenomas June 2019 Medicines:                Monitored Anesthesia Care Procedure:                Pre-Anesthesia Assessment:                           - Prior to the procedure, a History and Physical                            was performed, and patient medications and                            allergies were reviewed. The patient's tolerance of                            previous anesthesia was also reviewed. The risks                            and benefits of the procedure and the sedation                            options and risks were discussed with the patient.                            All questions were answered, and informed consent                            was obtained. Prior Anticoagulants: The patient has                            taken no anticoagulant or antiplatelet agents. ASA                            Grade Assessment: I - A normal, healthy patient.                            After reviewing the risks and benefits, the patient                            was deemed in satisfactory condition to undergo the  procedure.                           After obtaining informed consent, the colonoscope                            was passed under direct vision. Throughout the                            procedure, the patient's blood pressure, pulse, and                            oxygen saturations were monitored continuously. The                            CF [id number] #[id number] was introduced through the  anus                            and advanced to the the cecum, identified by                            appendiceal orifice and ileocecal valve. The                            colonoscopy was performed without difficulty. The                            patient tolerated the procedure well. The quality                            of the bowel preparation was good. The ileocecal                            valve, appendiceal orifice, and rectum were                            photographed. Scope In: 8:14:06 AM Scope Out: 8:27:39 AM Scope Withdrawal Time: 0 hours 9 minutes 40 seconds  Total Procedure Duration: 0 hours 13 minutes 33 seconds  Findings:                 The perianal and digital rectal examinations were                            normal.                           Repeat examination of right colon under NBI                            performed.                           A diminutive polyp was found in the distal  ascending colon. The polyp was sessile. The polyp                            was removed with a cold snare. Resection and                            retrieval were complete.                           Diverticula were found in the left colon.                           Internal hemorrhoids were found.                           The exam was otherwise without abnormality on                            direct and retroflexion views. Complications:            No immediate complications. Estimated Blood Loss:     Estimated blood loss was minimal. Impression:               - One diminutive polyp in the distal ascending                            colon, removed with a cold snare. Resected and                            retrieved.                           - Diverticulosis in the left colon.                           - Internal hemorrhoids.                           - The examination was otherwise normal on direct                            and  retroflexion views. Recommendation:           - Patient has a contact number available for                            emergencies. The signs and symptoms of potential                            delayed complications were discussed with the                            patient. Return to normal activities tomorrow.                            Written discharge instructions were provided to the  patient.                           - Resume previous diet.                           - Continue present medications.                           - Await pathology results.                           - No repeat surveillance colonoscopy due to age,                            current guidelines and low risk findings today. Mathias Bogacki L. Legrand, MD 08/05/2024 8:32:29 AM This report has been signed electronically.

## 2024-08-05 NOTE — Progress Notes (Signed)
 History and Physical:  This patient presents for endoscopic testing for: Encounter Diagnosis  Name Primary?   History of colonic polyps Yes    68 year old woman here today for surveillance colonoscopy with history of colon polyps.  2 subcentimeter tubular adenomas on last colonoscopy June 2019 Patient denies chronic abdominal pain, rectal bleeding, constipation or diarrhea.   Patient is otherwise without complaints or active issues today.   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   Nephrolithiasis    bilateral   Urgency of urination    Wears glasses      Past Surgical History: Past Surgical History:  Procedure Laterality Date   COLONOSCOPY     CYSTOSCOPY WITH RETROGRADE PYELOGRAM, URETEROSCOPY AND STENT PLACEMENT Left 09/22/2015   Procedure: CYSTOSCOPY WITH LEFT RETROGRADE PYELOGRAM, URETEROSCOPY, STONE EXTRACTION AND STENT PLACEMENT;  Surgeon: Garnette Shack, MD;  Location: [facility];  Service: Urology;  Laterality: Left;   FRACTURE SURGERY  1967   R leg 1967   HOLMIUM LASER APPLICATION Left 09/22/2015   Procedure: HOLMIUM LASER APPLICATION;  Surgeon: Garnette Shack, MD;  Location: [facility];  Service: Urology;  Laterality: Left;   NEPHROLITHOTOMY  1975   ORIF RIGHT ANKLE FX  1967   RIGHT SHOULDER SURGERY  2013   repair nerve   ROTATOR CUFF REPAIR Left    TUBAL LIGATION  1986    Allergies: Allergies  Allergen Reactions   Other Rash    Wool causes rash   Penicillins Hives    Outpatient Meds: Current Outpatient Medications  Medication Sig Dispense Refill   acetaminophen  (TYLENOL ) 500 MG tablet Take 500 mg by mouth as needed.     ciclopirox  (CICLODAN ) 8 % solution Apply topically at bedtime. Apply over nail and surrounding skin. Apply daily over previous  coat. After seven (7) days, may remove with alcohol and continue cycle. 6.6 mL 6   cyanocobalamin  (VITAMIN B12) 1000 MCG tablet daily.     diphenhydrAMINE (BENADRYL) 25 mg capsule Take 25 mg by mouth.     Vitamin D , Cholecalciferol, 10 MCG (400 UNIT) CAPS daily at 12 noon.     Acetaminophen  (TYLENOL ) 325 MG CAPS Take 500 mg by mouth as needed. (Patient not taking: Reported on 08/05/2024)     Multiple Vitamins-Minerals (OCUVITE ADULT 50+) CAPS Take 1 tablet by mouth daily.     tiZANidine  (ZANAFLEX ) 4 MG tablet Take 1 tablet (4 mg total) by mouth every 6 (six) hours as needed for muscle spasms. (Patient not taking: Reported on 08/05/2024) 30 tablet 0   Current Facility-Administered Medications  Medication Dose Route Frequency Provider Last Rate Last Admin   0.9 %  sodium chloride  infusion  500 mL Intravenous Once Danis, Christalyn Goertz L III, MD          ___________________________________________________________________ Objective   Exam:  BP (!) 148/88   Pulse (!) 56   Temp 97.8 F (36.6 C)   Resp 16   Ht 5' 4 (1.626 m)   Wt 150 lb (68 kg)   SpO2 99%   BMI 25.75 kg/m   CV: regular , S1/S2 Resp: clear to auscultation bilaterally, normal RR and effort noted GI: soft, no tenderness, with active bowel sounds.   Assessment: Encounter Diagnosis  Name Primary?   History  of colonic polyps Yes     Plan: Colonoscopy   The benefits and risks of the planned procedure(s) were described in detail with the patient or (when appropriate) their health care proxy.  Risks were outlined as including, but not limited to, bleeding, infection, perforation, adverse medication reaction leading to cardiac or pulmonary decompensation, pancreatitis (if ERCP).  The limitation of incomplete mucosal visualization was also discussed.  No guarantees or warranties were given.  The patient is appropriate for an endoscopic procedure in the ambulatory setting.   - Victory Brand, MD

## 2024-08-05 NOTE — Progress Notes (Signed)
 Called to room to assist during endoscopic procedure.  Patient ID and intended procedure confirmed with present staff. Received instructions for my participation in the procedure from the performing physician.

## 2024-08-05 NOTE — Progress Notes (Signed)
 Report given to PACU, vss

## 2024-08-05 NOTE — Progress Notes (Signed)
 [id number] Patient experiencing nausea.  MD updated and Zofran  4 mg IV given, vss

## 2024-08-06 ENCOUNTER — Telehealth: Payer: Self-pay

## 2024-08-06 NOTE — Telephone Encounter (Signed)
 No answer after follow up call. Voice message left.

## 2024-08-09 LAB — SURGICAL PATHOLOGY

## 2024-08-10 ENCOUNTER — Ambulatory Visit: Payer: Self-pay | Admitting: Gastroenterology

## 2024-10-11 DIAGNOSIS — H35363 Drusen (degenerative) of macula, bilateral: Secondary | ICD-10-CM | POA: Diagnosis not present

## 2024-10-11 DIAGNOSIS — H2513 Age-related nuclear cataract, bilateral: Secondary | ICD-10-CM | POA: Diagnosis not present

## 2024-10-11 DIAGNOSIS — H33193 Other retinoschisis and retinal cysts, bilateral: Secondary | ICD-10-CM | POA: Diagnosis not present

## 2024-10-11 DIAGNOSIS — H43823 Vitreomacular adhesion, bilateral: Secondary | ICD-10-CM | POA: Diagnosis not present

## 2025-01-11 ENCOUNTER — Ambulatory Visit (INDEPENDENT_AMBULATORY_CARE_PROVIDER_SITE_OTHER)

## 2025-01-11 VITALS — Ht 64.0 in | Wt 144.0 lb

## 2025-01-11 DIAGNOSIS — Z0000 Encounter for general adult medical examination without abnormal findings: Secondary | ICD-10-CM | POA: Diagnosis not present

## 2025-01-11 NOTE — Progress Notes (Signed)
 "  Chief Complaint  Patient presents with   Medicare Wellness     Subjective:   Alyssa Lamb is a 69 y.o. female who presents for a Medicare Annual Wellness Visit.  Visit info / Clinical Intake: Medicare Wellness Visit Type:: Subsequent Annual Wellness Visit Persons participating in visit and providing information:: patient Medicare Wellness Visit Mode:: Telephone If telephone:: video declined Since this visit was completed virtually, some vitals may be partially provided or unavailable. Missing vitals are due to the limitations of the virtual format.: Documented vitals are patient reported If Telephone or Video please confirm:: I connected with patient using audio/video enable telemedicine. I verified patient identity with two identifiers, discussed telehealth limitations, and patient agreed to proceed. Patient Location:: Home Provider Location:: Office Interpreter Needed?: No Pre-visit prep was completed: yes AWV questionnaire completed by patient prior to visit?: yes Date:: 01/10/25 Living arrangements:: lives with spouse/significant other Patient's Overall Health Status Rating: very good Typical amount of pain: some Does pain affect daily life?: no Are you currently prescribed opioids?: no  Dietary Habits and Nutritional Risks How many meals a day?: 3 Eats fruit and vegetables daily?: yes Most meals are obtained by: preparing own meals In the last 2 weeks, have you had any of the following?: none Diabetic:: no  Functional Status Activities of Daily Living (to include ambulation/medication): Independent Ambulation: Independent with device- listed below Home Assistive Devices/Equipment: Eyeglasses Medication Administration: Independent Home Management (perform basic housework or laundry): Independent Manage your own finances?: yes Primary transportation is: driving Concerns about vision?: no *vision screening is required for WTM* Concerns about hearing?: no  Fall  Screening Falls in the past year?: 0 Number of falls in past year: 0 Was there an injury with Fall?: 0 Fall Risk Category Calculator: 0 Patient Fall Risk Level: Low Fall Risk  Fall Risk Patient at Risk for Falls Due to: No Fall Risks Fall risk Follow up: Falls evaluation completed; Falls prevention discussed  Home and Transportation Safety: All rugs have non-skid backing?: yes All stairs or steps have railings?: yes Grab bars in the bathtub or shower?: (!) no Have non-skid surface in bathtub or shower?: yes Good home lighting?: yes Regular seat belt use?: yes Hospital stays in the last year:: no  Cognitive Assessment Difficulty concentrating, remembering, or making decisions? : no Will 6CIT or Mini Cog be Completed: yes What year is it?: 0 points What month is it?: 0 points Give patient an address phrase to remember (5 components): [address] [place], [place] About what time is it?: 0 points Count backwards from 20 to 1: 0 points Say the months of the year in reverse: 0 points Repeat the address phrase from earlier: 0 points 6 CIT Score: 0 points  Advance Directives (For Healthcare) Does Patient Have a Medical Advance Directive?: Yes Does patient want to make changes to medical advance directive?: Yes (Inpatient - patient requests chaplain consult to change a medical advance directive) Type of Advance Directive: Healthcare Power of [place]; Living will Copy of Healthcare Power of Attorney in Chart?: No - copy requested Copy of Living Will in Chart?: No - copy requested  Reviewed/Updated  Reviewed/Updated: Reviewed All (Medical, Surgical, Family, Medications, Allergies, Care Teams, Patient Goals)    Allergies (verified) Other and Penicillins   Current Medications (verified) Outpatient Encounter Medications as of 01/11/2025  Medication Sig   Acetaminophen  (TYLENOL ) 325 MG CAPS Take 500 mg by mouth as needed.   acetaminophen  (TYLENOL ) 500 MG tablet Take 500 mg by mouth  as needed.   ciclopirox  (CICLODAN ) 8 % solution Apply topically at bedtime. Apply over nail and surrounding skin. Apply daily over previous coat. After seven (7) days, may remove with alcohol and continue cycle.   cyanocobalamin  (VITAMIN B12) 1000 MCG tablet daily.   diphenhydrAMINE (BENADRYL) 25 mg capsule Take 25 mg by mouth.   Multiple Vitamins-Minerals (OCUVITE ADULT 50+) CAPS Take 1 tablet by mouth daily.   Vitamin D , Cholecalciferol, 10 MCG (400 UNIT) CAPS daily at 12 noon.   tiZANidine  (ZANAFLEX ) 4 MG tablet Take 1 tablet (4 mg total) by mouth every 6 (six) hours as needed for muscle spasms. (Patient not taking: Reported on 01/11/2025)   No facility-administered encounter medications on file as of 01/11/2025.    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   Nephrolithiasis    bilateral   Urgency of urination    Wears glasses    Past Surgical History:  Procedure Laterality Date   COLONOSCOPY     CYSTOSCOPY WITH RETROGRADE PYELOGRAM, URETEROSCOPY AND STENT PLACEMENT Left 09/22/2015   Procedure: CYSTOSCOPY WITH LEFT RETROGRADE PYELOGRAM, URETEROSCOPY, STONE EXTRACTION AND STENT PLACEMENT;  Surgeon: Garnette Shack, MD;  Location: [facility];  Service: Urology;  Laterality: Left;   FRACTURE SURGERY  1967   R leg 1967   HOLMIUM LASER APPLICATION Left 09/22/2015   Procedure: HOLMIUM LASER APPLICATION;  Surgeon: Garnette Shack, MD;  Location: [facility];  Service: Urology;  Laterality: Left;   NEPHROLITHOTOMY  1975   ORIF RIGHT ANKLE FX  1967   RIGHT SHOULDER SURGERY  2013   repair nerve   ROTATOR CUFF REPAIR Left    TUBAL LIGATION  1986   Family History  Problem Relation Age of Onset   Arthritis Mother    Hyperlipidemia Mother    Heart disease Mother    Hypertension Mother    Diabetes  Mother    Colon cancer Neg Hx    Esophageal cancer Neg Hx    Stomach cancer Neg Hx    BRCA 1/2 Neg Hx    Breast cancer Neg Hx    Colon polyps Neg Hx    Rectal cancer Neg Hx    Social History   Occupational History   Not on file  Tobacco Use   Smoking status: Former    Current packs/day: 0.00    Average packs/day: 1 pack/day for 8.0 years (8.0 ttl pk-yrs)    Types: Cigarettes    Start date: 09/18/1969    Quit date: 09/18/1977    Years since quitting: 47.3   Smokeless tobacco: Never   Tobacco comments:    Already have  Substance and Sexual Activity   Alcohol use: Yes    Alcohol/week: 2.0 standard drinks of alcohol    Types: 2 Glasses of wine per week    Comment: 1-2 glasses per week   Drug use: Never   Sexual activity: Not Currently    Birth control/protection: Surgical   Tobacco Counseling Counseling given: Yes Tobacco comments: Already have  SDOH Screenings   Food Insecurity: No Food Insecurity (01/11/2025)  Housing: Low Risk (01/11/2025)  Transportation Needs: No Transportation Needs (01/11/2025)  Utilities: Not At Risk (01/11/2025)  Alcohol Screen: Low Risk (01/10/2025)  Depression (PHQ2-9): Low Risk (01/11/2025)  Financial Resource Strain: Low  Risk (01/10/2025)  Physical Activity: Sufficiently Active (01/11/2025)  Social Connections: Moderately Isolated (01/11/2025)  Stress: No Stress Concern Present (01/11/2025)  Tobacco Use: Medium Risk (01/11/2025)  Health Literacy: Adequate Health Literacy (01/11/2025)   See flowsheets for full screening details  Depression Screen PHQ 2 & 9 Depression Scale- Over the past 2 weeks, how often have you been bothered by any of the following problems? Little interest or pleasure in doing things: 0 Feeling down, depressed, or hopeless (PHQ Adolescent also includes...irritable): 1 (sick Spouse) PHQ-2 Total Score: 1 Trouble falling or staying asleep, or sleeping too much: 1 (gets 6-7hrs of sleep) Feeling tired or having little energy:  0 Poor appetite or overeating (PHQ Adolescent also includes...weight loss): 0 Feeling bad about yourself - or that you are a failure or have let yourself or your family down: 0 Trouble concentrating on things, such as reading the newspaper or watching television (PHQ Adolescent also includes...like school work): 0 Moving or speaking so slowly that other people could have noticed. Or the opposite - being so fidgety or restless that you have been moving around a lot more than usual: 0 Thoughts that you would be better off dead, or of hurting yourself in some way: 0 PHQ-9 Total Score: 2 If you checked off any problems, how difficult have these problems made it for you to do your work, take care of things at home, or get along with other people?: Not difficult at all  Depression Treatment Depression Interventions/Treatment : EYV7-0 Score <4 Follow-up Not Indicated     Goals Addressed               This Visit's Progress     Patient Stated (pt-stated)        Patient stated she plans to continue walking daily             Objective:    Today's Vitals   01/11/25 1130  Weight: 144 lb (65.3 kg)  Height: 5' 4 (1.626 m)   Body mass index is 24.72 kg/m.  Hearing/Vision screen Hearing Screening - Comments:: Denies hearing difficulties   Vision Screening - Comments:: Wears rx glasses - up to date with routine eye exams with [facility] Immunizations and Health Maintenance Health Maintenance  Topic Date Due   Pneumococcal Vaccine: 50+ Years (2 of 2 - PCV20 or PCV21) 06/02/2020   COVID-19 Vaccine (7 - 2025-26 season) 08/23/2024   Mammogram  11/06/2025   Medicare Annual Wellness (AWV)  01/11/2026   DTaP/Tdap/Td (2 - Td or Tdap) 06/02/2029   Influenza Vaccine  Completed   Bone Density Scan  Completed   Hepatitis C Screening  Completed   Zoster Vaccines- Shingrix   Completed   Meningococcal B Vaccine  Aged Out   Colonoscopy  Discontinued        Assessment/Plan:  This is a routine  wellness examination for Alyssa Lamb.  Patient Care Team: Rollene Almarie LABOR, MD as PCP - General (Internal Medicine)  I have personally reviewed and noted the following in the patients chart:   Medical and social history Use of alcohol, tobacco or illicit drugs  Current medications and supplements including opioid prescriptions. Functional ability and status Nutritional status Physical activity Advanced directives List of other physicians Hospitalizations, surgeries, and ER visits in previous 12 months Vitals Screenings to include cognitive, depression, and falls Referrals and appointments  No orders of the defined types were placed in this encounter.  In addition, I have reviewed and discussed with patient certain preventive protocols, quality metrics,  and best practice recommendations. A written personalized care plan for preventive services as well as general preventive health recommendations were provided to patient.   Alyssa Lamb, CMA   01/11/2025   Return in 1 year (on 01/11/2026).  After Visit Summary: (MyChart) Due to this being a telephonic visit, the after visit summary with patients personalized plan was offered to patient via MyChart   Nurse Notes: scheduled CPE appt w/PCP for 05/2025; scheduled 2027 AWV appt "

## 2025-01-11 NOTE — Patient Instructions (Signed)
 Alyssa Lamb,  Thank you for taking the time for your Medicare Wellness Visit. I appreciate your continued commitment to your health goals. Please review the care plan we discussed, and feel free to reach out if I can assist you further.  Please note that Annual Wellness Visits do not include a physical exam. Some assessments may be limited, especially if the visit was conducted virtually. If needed, we may recommend an in-person follow-up with your provider.  Ongoing Care Seeing your primary care provider every 3 to 6 months helps us  monitor your health and provide consistent, personalized care.   Referrals If a referral was made during today's visit and you haven't received any updates within two weeks, please contact the referred provider directly to check on the status.  Recommended Screenings:  Health Maintenance  Topic Date Due   Pneumococcal Vaccine for age over 37 (2 of 2 - PCV20 or PCV21) 06/02/2020   COVID-19 Vaccine (7 - 2025-26 season) 08/23/2024   Breast Cancer Screening  11/06/2025   Medicare Annual Wellness Visit  01/11/2026   DTaP/Tdap/Td vaccine (2 - Td or Tdap) 06/02/2029   Flu Shot  Completed   Osteoporosis screening with Bone Density Scan  Completed   Hepatitis C Screening  Completed   Zoster (Shingles) Vaccine  Completed   Meningitis B Vaccine  Aged Out   Colon Cancer Screening  Discontinued       01/11/2025   11:31 AM  Advanced Directives  Does Patient Have a Medical Advance Directive? Yes  Type of Estate Agent of [place];Living will  Does patient want to make changes to medical advance directive? Yes (Inpatient - patient requests chaplain consult to change a medical advance directive)  Copy of Healthcare Power of Attorney in Chart? No - copy requested    Vision: Annual vision screenings are recommended for early detection of glaucoma, cataracts, and diabetic retinopathy. These exams can also reveal signs of chronic conditions such as  diabetes and high blood pressure.  Dental: Annual dental screenings help detect early signs of oral cancer, gum disease, and other conditions linked to overall health, including heart disease and diabetes.

## 2025-01-14 ENCOUNTER — Other Ambulatory Visit: Payer: Self-pay | Admitting: Internal Medicine

## 2025-02-22 ENCOUNTER — Ambulatory Visit: Admitting: Licensed Clinical Social Worker

## 2025-06-06 ENCOUNTER — Encounter: Admitting: Internal Medicine

## 2025-06-07 ENCOUNTER — Encounter: Admitting: Internal Medicine

## 2026-06-12 ENCOUNTER — Ambulatory Visit
